# Patient Record
Sex: Female | Born: 1940
Health system: Southern US, Community
[De-identification: ages and names within clinical notes are randomized; demographics above are authoritative.]

## PROBLEM LIST (undated history)

## (undated) DIAGNOSIS — H269 Unspecified cataract: Secondary | ICD-10-CM

## (undated) DIAGNOSIS — R0902 Hypoxemia: Secondary | ICD-10-CM

## (undated) DIAGNOSIS — I10 Essential (primary) hypertension: Secondary | ICD-10-CM

## (undated) DIAGNOSIS — J449 Chronic obstructive pulmonary disease, unspecified: Secondary | ICD-10-CM

## (undated) DIAGNOSIS — G709 Myoneural disorder, unspecified: Secondary | ICD-10-CM

## (undated) DIAGNOSIS — M1990 Unspecified osteoarthritis, unspecified site: Secondary | ICD-10-CM

## (undated) HISTORY — DX: Unspecified cataract: H26.9

## (undated) HISTORY — DX: Hypoxemia: R09.02

## (undated) HISTORY — DX: Chronic obstructive pulmonary disease, unspecified: J44.9

## (undated) HISTORY — DX: Myoneural disorder, unspecified: G70.9

## (undated) HISTORY — PX: JOINT REPLACEMENT: SHX530

## (undated) HISTORY — PX: SPINE SURGERY: SHX786

## (undated) HISTORY — PX: COLON SURGERY: SHX602

## (undated) HISTORY — DX: Unspecified osteoarthritis, unspecified site: M19.90

## (undated) HISTORY — DX: Essential (primary) hypertension: I10

---

## 2003-12-17 ENCOUNTER — Ambulatory Visit: Payer: Self-pay | Admitting: Diagnostic Radiology

## 2009-06-08 ENCOUNTER — Ambulatory Visit: Payer: Self-pay | Admitting: Otolaryngology

## 2013-06-19 DIAGNOSIS — I480 Paroxysmal atrial fibrillation: Secondary | ICD-10-CM | POA: Insufficient documentation

## 2013-06-19 DIAGNOSIS — C189 Malignant neoplasm of colon, unspecified: Secondary | ICD-10-CM | POA: Insufficient documentation

## 2014-04-16 ENCOUNTER — Ambulatory Visit
Admit: 2014-04-16 | Disposition: A | Discharge status: Home / Self Care | Payer: Self-pay | Attending: Internal Medicine | Admitting: Internal Medicine

## 2014-05-08 ENCOUNTER — Inpatient Hospital Stay
Admission: EM | Admit: 2014-05-08 | Discharge: 2014-05-17 | DRG: 870 | Disposition: A | Discharge status: Other Acute Inpatient Hospital | Payer: Medicare Other | Attending: Internal Medicine | Admitting: Internal Medicine

## 2014-05-08 DIAGNOSIS — I4891 Unspecified atrial fibrillation: Secondary | ICD-10-CM | POA: Diagnosis present

## 2014-05-08 DIAGNOSIS — J159 Unspecified bacterial pneumonia: Secondary | ICD-10-CM | POA: Diagnosis present

## 2014-05-08 DIAGNOSIS — T17990A Other foreign object in respiratory tract, part unspecified in causing asphyxiation, initial encounter: Secondary | ICD-10-CM | POA: Diagnosis present

## 2014-05-08 DIAGNOSIS — M8580 Other specified disorders of bone density and structure, unspecified site: Secondary | ICD-10-CM | POA: Diagnosis present

## 2014-05-08 DIAGNOSIS — A419 Sepsis, unspecified organism: Secondary | ICD-10-CM | POA: Diagnosis present

## 2014-05-08 DIAGNOSIS — Z79899 Other long term (current) drug therapy: Secondary | ICD-10-CM

## 2014-05-08 DIAGNOSIS — I129 Hypertensive chronic kidney disease with stage 1 through stage 4 chronic kidney disease, or unspecified chronic kidney disease: Secondary | ICD-10-CM | POA: Diagnosis present

## 2014-05-08 DIAGNOSIS — N170 Acute kidney failure with tubular necrosis: Secondary | ICD-10-CM | POA: Diagnosis present

## 2014-05-08 DIAGNOSIS — J441 Chronic obstructive pulmonary disease with (acute) exacerbation: Secondary | ICD-10-CM | POA: Diagnosis present

## 2014-05-08 DIAGNOSIS — Z9221 Personal history of antineoplastic chemotherapy: Secondary | ICD-10-CM | POA: Diagnosis not present

## 2014-05-08 DIAGNOSIS — G9340 Encephalopathy, unspecified: Secondary | ICD-10-CM | POA: Diagnosis present

## 2014-05-08 DIAGNOSIS — N189 Chronic kidney disease, unspecified: Secondary | ICD-10-CM | POA: Diagnosis present

## 2014-05-08 DIAGNOSIS — I248 Other forms of acute ischemic heart disease: Secondary | ICD-10-CM | POA: Diagnosis present

## 2014-05-08 DIAGNOSIS — X58XXXA Exposure to other specified factors, initial encounter: Secondary | ICD-10-CM | POA: Diagnosis not present

## 2014-05-08 DIAGNOSIS — F17210 Nicotine dependence, cigarettes, uncomplicated: Secondary | ICD-10-CM | POA: Diagnosis present

## 2014-05-08 DIAGNOSIS — D696 Thrombocytopenia, unspecified: Secondary | ICD-10-CM | POA: Diagnosis present

## 2014-05-08 DIAGNOSIS — J9601 Acute respiratory failure with hypoxia: Secondary | ICD-10-CM | POA: Diagnosis present

## 2014-05-08 DIAGNOSIS — Z9049 Acquired absence of other specified parts of digestive tract: Secondary | ICD-10-CM | POA: Diagnosis present

## 2014-05-08 DIAGNOSIS — Z7982 Long term (current) use of aspirin: Secondary | ICD-10-CM

## 2014-05-08 DIAGNOSIS — E785 Hyperlipidemia, unspecified: Secondary | ICD-10-CM | POA: Diagnosis present

## 2014-05-08 DIAGNOSIS — D649 Anemia, unspecified: Secondary | ICD-10-CM | POA: Diagnosis present

## 2014-05-08 DIAGNOSIS — R6521 Severe sepsis with septic shock: Secondary | ICD-10-CM | POA: Diagnosis present

## 2014-05-08 DIAGNOSIS — Z9889 Other specified postprocedural states: Secondary | ICD-10-CM | POA: Diagnosis not present

## 2014-05-08 DIAGNOSIS — Z85038 Personal history of other malignant neoplasm of large intestine: Secondary | ICD-10-CM | POA: Diagnosis not present

## 2014-05-08 DIAGNOSIS — Z716 Tobacco abuse counseling: Secondary | ICD-10-CM | POA: Diagnosis present

## 2014-05-08 DIAGNOSIS — J9602 Acute respiratory failure with hypercapnia: Secondary | ICD-10-CM | POA: Diagnosis present

## 2014-05-08 DIAGNOSIS — J9600 Acute respiratory failure, unspecified whether with hypoxia or hypercapnia: Secondary | ICD-10-CM | POA: Diagnosis not present

## 2014-05-08 DIAGNOSIS — R34 Anuria and oliguria: Secondary | ICD-10-CM | POA: Diagnosis present

## 2014-05-08 LAB — CBC WITH DIFFERENTIAL/PLATELET
BASOS ABS: 0 10*3/uL (ref 0.0–0.1)
Basophil %: 0.2 %
EOS PCT: 0.1 %
Eosinophil #: 0 10*3/uL (ref 0.0–0.7)
HCT: 41.7 % (ref 35.0–47.0)
HGB: 13 g/dL (ref 12.0–16.0)
LYMPHS ABS: 0.7 10*3/uL — AB (ref 1.0–3.6)
Lymphocyte %: 5.2 %
MCH: 28.2 pg (ref 26.0–34.0)
MCHC: 31.1 g/dL — ABNORMAL LOW (ref 32.0–36.0)
MCV: 91 fL (ref 80–100)
MONO ABS: 0.7 x10 3/mm (ref 0.2–0.9)
MONOS PCT: 5.4 %
NEUTROS ABS: 11.3 10*3/uL — AB (ref 1.4–6.5)
Neutrophil %: 89.1 %
Platelet: 177 10*3/uL (ref 150–440)
RBC: 4.6 10*6/uL (ref 3.80–5.20)
RDW: 15.8 % — AB (ref 11.5–14.5)
WBC: 12.7 10*3/uL — ABNORMAL HIGH (ref 3.6–11.0)

## 2014-05-08 LAB — PROTIME-INR
INR: 0.9
Prothrombin Time: 11.8 secs

## 2014-05-08 LAB — URINALYSIS, COMPLETE
Bacteria: NONE SEEN
Bilirubin,UR: NEGATIVE
Blood: NEGATIVE
GLUCOSE, UR: NEGATIVE mg/dL (ref 0–75)
KETONE: NEGATIVE
Leukocyte Esterase: NEGATIVE
NITRITE: NEGATIVE
Ph: 5 (ref 4.5–8.0)
Protein: NEGATIVE
Specific Gravity: 1.014 (ref 1.003–1.030)
Squamous Epithelial: NONE SEEN

## 2014-05-08 LAB — CK-MB
CK-MB: 13.3 ng/mL — ABNORMAL HIGH
CK-MB: 14.5 ng/mL — ABNORMAL HIGH

## 2014-05-08 LAB — COMPREHENSIVE METABOLIC PANEL
ALBUMIN: 3.2 g/dL — AB
ALK PHOS: 70 U/L
Anion Gap: 13 (ref 7–16)
BUN: 51 mg/dL — AB
Bilirubin,Total: 0.2 mg/dL — ABNORMAL LOW
CALCIUM: 8.7 mg/dL — AB
CHLORIDE: 90 mmol/L — AB
CREATININE: 2.9 mg/dL — AB
Co2: 28 mmol/L
EGFR (African American): 18 — ABNORMAL LOW
EGFR (Non-African Amer.): 16 — ABNORMAL LOW
Glucose: 113 mg/dL — ABNORMAL HIGH
POTASSIUM: 4.5 mmol/L
SGOT(AST): 22 U/L
SGPT (ALT): 12 U/L — ABNORMAL LOW
SODIUM: 131 mmol/L — AB
Total Protein: 6.8 g/dL

## 2014-05-08 LAB — LACTIC ACID, PLASMA: LACTIC ACID, VENOUS: 1.6 mmol/L

## 2014-05-08 LAB — APTT: Activated PTT: 28 secs (ref 23.6–35.9)

## 2014-05-08 LAB — PHOSPHORUS: PHOSPHORUS: 7 mg/dL — AB

## 2014-05-08 LAB — TROPONIN I
TROPONIN-I: 0.5 ng/mL — AB
Troponin-I: 0.46 ng/mL — ABNORMAL HIGH

## 2014-05-08 LAB — MAGNESIUM: MAGNESIUM: 1.2 mg/dL — AB

## 2014-05-09 LAB — CBC WITH DIFFERENTIAL/PLATELET
BASOS PCT: 0.1 %
Basophil #: 0 10*3/uL (ref 0.0–0.1)
EOS ABS: 0 10*3/uL (ref 0.0–0.7)
Eosinophil %: 0.2 %
HCT: 36.9 % (ref 35.0–47.0)
HGB: 11.4 g/dL — AB (ref 12.0–16.0)
Lymphocyte #: 0.4 10*3/uL — ABNORMAL LOW (ref 1.0–3.6)
Lymphocyte %: 3.3 %
MCH: 28.2 pg (ref 26.0–34.0)
MCHC: 30.9 g/dL — AB (ref 32.0–36.0)
MCV: 91 fL (ref 80–100)
MONO ABS: 0.8 x10 3/mm (ref 0.2–0.9)
Monocyte %: 6.2 %
Neutrophil #: 11.4 10*3/uL — ABNORMAL HIGH (ref 1.4–6.5)
Neutrophil %: 90.2 %
Platelet: 139 10*3/uL — ABNORMAL LOW (ref 150–440)
RBC: 4.04 10*6/uL (ref 3.80–5.20)
RDW: 15.6 % — ABNORMAL HIGH (ref 11.5–14.5)
WBC: 12.6 10*3/uL — ABNORMAL HIGH (ref 3.6–11.0)

## 2014-05-09 LAB — LIPID PANEL
CHOLESTEROL: 101 mg/dL
HDL Cholesterol: 41 mg/dL
Ldl Cholesterol, Calc: 44 mg/dL
TRIGLYCERIDES: 80 mg/dL
VLDL Cholesterol, Calc: 16 mg/dL

## 2014-05-09 LAB — BASIC METABOLIC PANEL
ANION GAP: 6 — AB (ref 7–16)
BUN: 43 mg/dL — AB
CALCIUM: 7.9 mg/dL — AB
CO2: 20 mmol/L — AB
Chloride: 110 mmol/L
Creatinine: 1.9 mg/dL — ABNORMAL HIGH
EGFR (African American): 30 — ABNORMAL LOW
EGFR (Non-African Amer.): 26 — ABNORMAL LOW
Glucose: 146 mg/dL — ABNORMAL HIGH
POTASSIUM: 4.4 mmol/L
Sodium: 136 mmol/L

## 2014-05-09 LAB — TROPONIN I: TROPONIN-I: 0.46 ng/mL — AB

## 2014-05-09 LAB — PHOSPHORUS: PHOSPHORUS: 3.5 mg/dL

## 2014-05-09 LAB — CK-MB: CK-MB: 15.2 ng/mL — AB

## 2014-05-09 LAB — MAGNESIUM: Magnesium: 2.1 mg/dL

## 2014-05-09 LAB — HEPARIN LEVEL (UNFRACTIONATED): Anti-Xa(Unfractionated): <0.1 IU/mL — ABNORMAL LOW (ref 0.30–0.70)

## 2014-05-10 LAB — CBC WITH DIFFERENTIAL/PLATELET
BASOS PCT: 0.2 %
Basophil #: 0 10*3/uL (ref 0.0–0.1)
EOS PCT: 1 %
Eosinophil #: 0.1 10*3/uL (ref 0.0–0.7)
HCT: 33.4 % — ABNORMAL LOW (ref 35.0–47.0)
HGB: 10.3 g/dL — ABNORMAL LOW (ref 12.0–16.0)
Lymphocyte #: 0.6 10*3/uL — ABNORMAL LOW (ref 1.0–3.6)
Lymphocyte %: 7.8 %
MCH: 28.1 pg (ref 26.0–34.0)
MCHC: 30.7 g/dL — ABNORMAL LOW (ref 32.0–36.0)
MCV: 92 fL (ref 80–100)
Monocyte #: 0.6 x10 3/mm (ref 0.2–0.9)
Monocyte %: 7.4 %
Neutrophil #: 6.5 10*3/uL (ref 1.4–6.5)
Neutrophil %: 83.6 %
PLATELETS: 126 10*3/uL — AB (ref 150–440)
RBC: 3.65 10*6/uL — AB (ref 3.80–5.20)
RDW: 15.5 % — ABNORMAL HIGH (ref 11.5–14.5)
WBC: 7.8 10*3/uL (ref 3.6–11.0)

## 2014-05-10 LAB — BASIC METABOLIC PANEL
Anion Gap: 3 — ABNORMAL LOW (ref 7–16)
BUN: 38 mg/dL — ABNORMAL HIGH
CHLORIDE: 114 mmol/L — AB
CO2: 22 mmol/L
CREATININE: 1.52 mg/dL — AB
Calcium, Total: 8.1 mg/dL — ABNORMAL LOW
EGFR (African American): 39 — ABNORMAL LOW
GFR CALC NON AF AMER: 34 — AB
Glucose: 159 mg/dL — ABNORMAL HIGH
POTASSIUM: 3.6 mmol/L
Sodium: 139 mmol/L

## 2014-05-10 LAB — HEPARIN LEVEL (UNFRACTIONATED)
ANTI-XA(UNFRACTIONATED): 0.67 [IU]/mL (ref 0.30–0.70)
Anti-Xa(Unfractionated): 0.41 IU/mL (ref 0.30–0.70)

## 2014-05-10 LAB — URINE CULTURE

## 2014-05-10 LAB — MAGNESIUM: MAGNESIUM: 1.9 mg/dL

## 2014-05-11 LAB — CBC WITH DIFFERENTIAL/PLATELET
BANDS NEUTROPHIL: 6 %
COMMENT - H1-COM3: NORMAL
HCT: 36.6 % (ref 35.0–47.0)
HGB: 10.9 g/dL — ABNORMAL LOW (ref 12.0–16.0)
Lymphocytes: 4 %
MCH: 28.4 pg (ref 26.0–34.0)
MCHC: 29.9 g/dL — AB (ref 32.0–36.0)
MCV: 95 fL (ref 80–100)
MONOS PCT: 6 %
MYELOCYTE: 1 %
Metamyelocyte: 2 %
NRBC/100 WBC: 1 /
PLATELETS: 106 10*3/uL — AB (ref 150–440)
RBC: 3.86 10*6/uL (ref 3.80–5.20)
RDW: 15.7 % — AB (ref 11.5–14.5)
SEGMENTED NEUTROPHILS: 81 %
WBC: 14.9 10*3/uL — ABNORMAL HIGH (ref 3.6–11.0)

## 2014-05-11 LAB — BASIC METABOLIC PANEL
ANION GAP: 1 — AB (ref 7–16)
Anion Gap: 7 (ref 7–16)
BUN: 39 mg/dL — ABNORMAL HIGH
BUN: 41 mg/dL — AB
CALCIUM: 8.6 mg/dL — AB
CREATININE: 2.11 mg/dL — AB
CREATININE: 2.5 mg/dL — AB
Calcium, Total: 8.6 mg/dL — ABNORMAL LOW
Chloride: 117 mmol/L — ABNORMAL HIGH
Chloride: 117 mmol/L — ABNORMAL HIGH
Co2: 22 mmol/L
Co2: 18 mmol/L — ABNORMAL LOW
EGFR (African American): 26 — ABNORMAL LOW
EGFR (African American): 22 — ABNORMAL LOW
EGFR (Non-African Amer.): 23 — ABNORMAL LOW
GFR CALC NON AF AMER: 19 — AB
GLUCOSE: 165 mg/dL — AB
Glucose: 142 mg/dL — ABNORMAL HIGH
Potassium: 5.1 mmol/L
Potassium: 3.3 mmol/L — ABNORMAL LOW
SODIUM: 142 mmol/L
Sodium: 140 mmol/L

## 2014-05-11 LAB — MAGNESIUM: Magnesium: 2 mg/dL

## 2014-05-11 LAB — HEPARIN LEVEL (UNFRACTIONATED): Anti-Xa(Unfractionated): 0.95 IU/mL — ABNORMAL HIGH (ref 0.30–0.70)

## 2014-05-11 LAB — PHOSPHORUS: Phosphorus: 5.4 mg/dL — ABNORMAL HIGH

## 2014-05-12 LAB — CBC WITH DIFFERENTIAL/PLATELET
HCT: 36.7 % (ref 35.0–47.0)
HGB: 11.5 g/dL — ABNORMAL LOW (ref 12.0–16.0)
Lymphocytes: 8 %
MCH: 28.1 pg (ref 26.0–34.0)
MCHC: 31.2 g/dL — AB (ref 32.0–36.0)
MCV: 90 fL (ref 80–100)
Monocytes: 3 %
NRBC/100 WBC: 2 /
Platelet: 71 10*3/uL — ABNORMAL LOW (ref 150–440)
RBC: 4.07 10*6/uL (ref 3.80–5.20)
RDW: 16 % — ABNORMAL HIGH (ref 11.5–14.5)
Segmented Neutrophils: 89 %
WBC: 11.4 10*3/uL — ABNORMAL HIGH (ref 3.6–11.0)

## 2014-05-12 LAB — BASIC METABOLIC PANEL
Anion Gap: 5 — ABNORMAL LOW (ref 7–16)
BUN: 46 mg/dL — ABNORMAL HIGH
CREATININE: 2.66 mg/dL — AB
Calcium, Total: 8.7 mg/dL — ABNORMAL LOW
Chloride: 115 mmol/L — ABNORMAL HIGH
Co2: 19 mmol/L — ABNORMAL LOW
EGFR (African American): 20 — ABNORMAL LOW
EGFR (Non-African Amer.): 17 — ABNORMAL LOW
Glucose: 120 mg/dL — ABNORMAL HIGH
POTASSIUM: 3.7 mmol/L
SODIUM: 139 mmol/L

## 2014-05-12 LAB — VANCOMYCIN, TROUGH: Vancomycin, Trough: 20 ug/mL

## 2014-05-12 LAB — PHOSPHORUS: Phosphorus: 2.4 mg/dL — ABNORMAL LOW

## 2014-05-12 LAB — MAGNESIUM: Magnesium: 1.8 mg/dL

## 2014-05-13 LAB — BASIC METABOLIC PANEL
Anion Gap: 5 — ABNORMAL LOW (ref 7–16)
BUN: 59 mg/dL — ABNORMAL HIGH
CHLORIDE: 114 mmol/L — AB
Calcium, Total: 9.1 mg/dL
Co2: 20 mmol/L — ABNORMAL LOW
Creatinine: 3.5 mg/dL — ABNORMAL HIGH
EGFR (African American): 14 — ABNORMAL LOW
EGFR (Non-African Amer.): 12 — ABNORMAL LOW
GLUCOSE: 112 mg/dL — AB
Potassium: 4.2 mmol/L
Sodium: 139 mmol/L

## 2014-05-13 LAB — CBC WITH DIFFERENTIAL/PLATELET
BASOS ABS: 0 10*3/uL (ref 0.0–0.1)
Basophil %: 0.3 %
EOS ABS: 0 10*3/uL (ref 0.0–0.7)
EOS PCT: 0 %
HCT: 34.4 % — ABNORMAL LOW (ref 35.0–47.0)
HGB: 10.9 g/dL — ABNORMAL LOW (ref 12.0–16.0)
LYMPHS ABS: 0.6 10*3/uL — AB (ref 1.0–3.6)
Lymphocyte %: 4.3 %
MCH: 28.4 pg (ref 26.0–34.0)
MCHC: 31.7 g/dL — AB (ref 32.0–36.0)
MCV: 90 fL (ref 80–100)
Monocyte #: 0.8 x10 3/mm (ref 0.2–0.9)
Monocyte %: 5.7 %
NEUTROS ABS: 12.2 10*3/uL — AB (ref 1.4–6.5)
Neutrophil %: 89.7 %
Platelet: 72 10*3/uL — ABNORMAL LOW (ref 150–440)
RBC: 3.84 10*6/uL (ref 3.80–5.20)
RDW: 16.6 % — AB (ref 11.5–14.5)
WBC: 13.6 10*3/uL — ABNORMAL HIGH (ref 3.6–11.0)

## 2014-05-13 LAB — PHOSPHORUS: PHOSPHORUS: 3.1 mg/dL

## 2014-05-13 LAB — CULTURE, BLOOD (SINGLE)

## 2014-05-13 LAB — VANCOMYCIN, TROUGH: VANCOMYCIN, TROUGH: 18 ug/mL

## 2014-05-14 LAB — BASIC METABOLIC PANEL
Anion Gap: 7 (ref 7–16)
BUN: 60 mg/dL — AB
Calcium, Total: 8.5 mg/dL — ABNORMAL LOW
Chloride: 109 mmol/L
Co2: 23 mmol/L
Creatinine: 3.51 mg/dL — ABNORMAL HIGH
EGFR (African American): 14 — ABNORMAL LOW
GFR CALC NON AF AMER: 12 — AB
Glucose: 118 mg/dL — ABNORMAL HIGH
POTASSIUM: 3.6 mmol/L
SODIUM: 139 mmol/L

## 2014-05-14 LAB — CBC WITH DIFFERENTIAL/PLATELET
Basophil #: 0 10*3/uL (ref 0.0–0.1)
Basophil %: 0.1 %
EOS ABS: 0 10*3/uL (ref 0.0–0.7)
EOS PCT: 0.3 %
HCT: 28.6 % — ABNORMAL LOW (ref 35.0–47.0)
HGB: 9 g/dL — ABNORMAL LOW (ref 12.0–16.0)
Lymphocyte #: 0.8 10*3/uL — ABNORMAL LOW (ref 1.0–3.6)
Lymphocyte %: 5.8 %
MCH: 27.7 pg (ref 26.0–34.0)
MCHC: 31.6 g/dL — ABNORMAL LOW (ref 32.0–36.0)
MCV: 88 fL (ref 80–100)
Monocyte #: 1.3 x10 3/mm — ABNORMAL HIGH (ref 0.2–0.9)
Monocyte %: 9.5 %
NEUTROS ABS: 11.7 10*3/uL — AB (ref 1.4–6.5)
Neutrophil %: 84.3 %
Platelet: 53 10*3/uL — ABNORMAL LOW (ref 150–440)
RBC: 3.26 10*6/uL — AB (ref 3.80–5.20)
RDW: 16.1 % — ABNORMAL HIGH (ref 11.5–14.5)
WBC: 13.9 10*3/uL — ABNORMAL HIGH (ref 3.6–11.0)

## 2014-05-14 LAB — PHOSPHORUS: Phosphorus: 3.2 mg/dL

## 2014-05-14 LAB — CULTURE, BLOOD (SINGLE)

## 2014-05-14 LAB — VANCOMYCIN, TROUGH: Vancomycin, Trough: 16 ug/mL

## 2014-05-15 DIAGNOSIS — A419 Sepsis, unspecified organism: Secondary | ICD-10-CM | POA: Diagnosis present

## 2014-05-15 LAB — CBC WITH DIFFERENTIAL/PLATELET
BASOS PCT: 0.2 %
Basophil #: 0 10*3/uL (ref 0.0–0.1)
EOS PCT: 1.7 %
Eosinophil #: 0.2 10*3/uL (ref 0.0–0.7)
HCT: 26.8 % — ABNORMAL LOW (ref 35.0–47.0)
HGB: 8.7 g/dL — AB (ref 12.0–16.0)
LYMPHS PCT: 8.8 %
Lymphocyte #: 1.2 10*3/uL (ref 1.0–3.6)
MCH: 28.2 pg (ref 26.0–34.0)
MCHC: 32.5 g/dL (ref 32.0–36.0)
MCV: 87 fL (ref 80–100)
MONOS PCT: 8.8 %
Monocyte #: 1.2 x10 3/mm — ABNORMAL HIGH (ref 0.2–0.9)
NEUTROS ABS: 11.2 10*3/uL — AB (ref 1.4–6.5)
Neutrophil %: 80.5 %
Platelet: 50 10*3/uL — ABNORMAL LOW (ref 150–440)
RBC: 3.09 10*6/uL — ABNORMAL LOW (ref 3.80–5.20)
RDW: 16.6 % — ABNORMAL HIGH (ref 11.5–14.5)
WBC: 13.9 10*3/uL — ABNORMAL HIGH (ref 3.6–11.0)

## 2014-05-15 LAB — BASIC METABOLIC PANEL
Anion Gap: 6 — ABNORMAL LOW (ref 7–16)
BUN: 54 mg/dL — AB
CALCIUM: 8.3 mg/dL — AB
CO2: 26 mmol/L
Chloride: 107 mmol/L
Creatinine: 3.18 mg/dL — ABNORMAL HIGH
EGFR (African American): 16 — ABNORMAL LOW
EGFR (Non-African Amer.): 14 — ABNORMAL LOW
GLUCOSE: 121 mg/dL — AB
Potassium: 3.3 mmol/L — ABNORMAL LOW
SODIUM: 139 mmol/L

## 2014-05-15 LAB — PHOSPHORUS: PHOSPHORUS: 2.4 mg/dL — AB

## 2014-05-15 LAB — POTASSIUM
Potassium: 3.8 mmol/L
Potassium: 3.3 mmol/L — ABNORMAL LOW

## 2014-05-15 LAB — MAGNESIUM: MAGNESIUM: 1.7 mg/dL

## 2014-05-15 LAB — BRONCHIAL WASH CULTURE

## 2014-05-15 MED ORDER — GABAPENTIN 250 MG/5ML PO SOLN
600.0000 mg | Freq: Three times a day (TID) | ORAL | Status: DC
  Administered 2014-05-16 – 2014-05-17 (×5): 600 mg via ORAL
  Filled 2014-05-15 (×11): qty 12

## 2014-05-15 MED ORDER — CHLORHEXIDINE GLUCONATE 0.12 % MT SOLN: 15.0000 mL | Freq: Two times a day (BID) | OROMUCOSAL | Status: DC

## 2014-05-15 MED ORDER — HYDRALAZINE HCL 20 MG/ML IJ SOLN: 10.0000 mg | INTRAMUSCULAR | Status: DC | PRN

## 2014-05-15 MED ORDER — ACETAMINOPHEN 325 MG PO TABS: 325.0000 mg | ORAL_TABLET | ORAL | Status: DC | PRN

## 2014-05-15 MED ORDER — SODIUM CHLORIDE 0.9 % IJ SOLN: 10.0000 mL | INTRAMUSCULAR | Status: DC | PRN

## 2014-05-15 MED ORDER — FENTANYL CITRATE (PF) 100 MCG/2ML IJ SOLN
50.0000 ug | INTRAMUSCULAR | Status: DC | PRN
  Administered 2014-05-16: 50 ug via INTRAVENOUS

## 2014-05-15 MED ORDER — BUDESONIDE 0.5 MG/2ML IN SUSP: 0.5000 mg | Freq: Two times a day (BID) | RESPIRATORY_TRACT | Status: DC

## 2014-05-15 MED ORDER — DILTIAZEM HCL 30 MG PO TABS
30.0000 mg | ORAL_TABLET | Freq: Four times a day (QID) | ORAL | Status: DC
  Administered 2014-05-16 – 2014-05-17 (×8): 30 mg via ORAL
  Filled 2014-05-15 (×7): qty 1

## 2014-05-15 MED ORDER — NITROGLYCERIN 0.4 MG SL SUBL: 0.4000 mg | SUBLINGUAL_TABLET | SUBLINGUAL | Status: DC | PRN

## 2014-05-15 MED ORDER — PNEUMOCOCCAL VAC POLYVALENT 25 MCG/0.5ML IJ INJ: 0.5000 mL | INJECTION | INTRAMUSCULAR | Status: DC | PRN

## 2014-05-15 MED ORDER — SODIUM CHLORIDE 0.9 % IJ SOLN
10.0000 mL | Freq: Two times a day (BID) | INTRAMUSCULAR | Status: DC
  Administered 2014-05-16 – 2014-05-17 (×4): 10 mL via INTRAVENOUS

## 2014-05-15 MED ORDER — IPRATROPIUM-ALBUTEROL 0.5-2.5 (3) MG/3ML IN SOLN
3.0000 mL | RESPIRATORY_TRACT | Status: DC
  Administered 2014-05-16 – 2014-05-17 (×12): 3 mL via RESPIRATORY_TRACT
  Filled 2014-05-15 (×10): qty 3

## 2014-05-15 MED ORDER — ONDANSETRON HCL 4 MG/2ML IJ SOLN: 4.0000 mg | Freq: Four times a day (QID) | INTRAMUSCULAR | Status: DC | PRN

## 2014-05-15 MED ORDER — VITAL HIGH PROTEIN PO LIQD
1000.0000 mL | ORAL | Status: DC
  Administered 2014-05-15 – 2014-05-16 (×2): 1000 mL

## 2014-05-15 MED ORDER — ASPIRIN 81 MG PO CHEW
81.0000 mg | CHEWABLE_TABLET | Freq: Every day | ORAL | Status: DC
  Administered 2014-05-16 – 2014-05-17 (×2): 81 mg via ORAL
  Filled 2014-05-15 (×2): qty 1

## 2014-05-15 MED ORDER — DOCUSATE SODIUM 50 MG/5ML PO LIQD
100.0000 mg | Freq: Every day | ORAL | Status: DC
  Administered 2014-05-16 – 2014-05-17 (×2): 100 mg via ORAL
  Filled 2014-05-15 (×2): qty 10

## 2014-05-15 MED ORDER — BUDESONIDE 0.5 MG/2ML IN SUSP
0.5000 mg | Freq: Two times a day (BID) | RESPIRATORY_TRACT | Status: DC
  Administered 2014-05-16 – 2014-05-17 (×4): 0.5 mg via RESPIRATORY_TRACT
  Filled 2014-05-15 (×4): qty 2

## 2014-05-15 MED ORDER — LORAZEPAM 2 MG/ML IJ SOLN: 1.0000 mg | INTRAMUSCULAR | Status: DC | PRN

## 2014-05-15 MED ORDER — FAMOTIDINE 40 MG/5ML PO SUSR
20.0000 mg | ORAL | Status: DC
  Administered 2014-05-17: 20 mg via ORAL
  Filled 2014-05-15: qty 2.5

## 2014-05-15 MED ORDER — INSULIN ASPART 100 UNIT/ML ~~LOC~~ SOLN: 0.0000 [IU] | Freq: Four times a day (QID) | SUBCUTANEOUS | Status: DC

## 2014-05-15 MED ORDER — PIPERACILLIN-TAZOBACTAM 3.375 G IVPB
3.3750 g | Freq: Two times a day (BID) | INTRAVENOUS | Status: DC
  Administered 2014-05-16 – 2014-05-17 (×4): 3.375 g via INTRAVENOUS
  Filled 2014-05-15 (×4): qty 50

## 2014-05-16 DIAGNOSIS — A419 Sepsis, unspecified organism: Principal | ICD-10-CM

## 2014-05-16 DIAGNOSIS — G9340 Encephalopathy, unspecified: Secondary | ICD-10-CM

## 2014-05-16 DIAGNOSIS — N170 Acute kidney failure with tubular necrosis: Secondary | ICD-10-CM

## 2014-05-16 DIAGNOSIS — J9600 Acute respiratory failure, unspecified whether with hypoxia or hypercapnia: Secondary | ICD-10-CM

## 2014-05-16 LAB — GLUCOSE, CAPILLARY
GLUCOSE-CAPILLARY: 120 mg/dL — AB (ref 70–99)
GLUCOSE-CAPILLARY: 123 mg/dL — AB (ref 70–99)
Glucose-Capillary: 117 mg/dL — ABNORMAL HIGH (ref 70–99)
Glucose-Capillary: 116 mg/dL — ABNORMAL HIGH (ref 70–99)
Glucose-Capillary: 129 mg/dL — ABNORMAL HIGH (ref 70–99)

## 2014-05-16 LAB — BASIC METABOLIC PANEL
Anion gap: 5 (ref 5–15)
BUN: 39 mg/dL — ABNORMAL HIGH (ref 6–20)
CO2: 29 mmol/L (ref 22–32)
Calcium: 8.1 mg/dL — ABNORMAL LOW (ref 8.9–10.3)
Chloride: 105 mmol/L (ref 101–111)
Creatinine, Ser: 2.52 mg/dL — ABNORMAL HIGH (ref 0.44–1.00)
GFR, EST AFRICAN AMERICAN: 21 mL/min — AB (ref >60)
GFR, EST NON AFRICAN AMERICAN: 18 mL/min — AB (ref >60)
Glucose, Bld: 125 mg/dL — ABNORMAL HIGH (ref 65–99)
POTASSIUM: 3.9 mmol/L (ref 3.5–5.1)
SODIUM: 139 mmol/L (ref 135–145)

## 2014-05-16 LAB — MAGNESIUM: MAGNESIUM: 1.6 mg/dL — AB (ref 1.7–2.4)

## 2014-05-16 LAB — PHOSPHORUS: Phosphorus: 1.6 mg/dL — ABNORMAL LOW (ref 2.5–4.6)

## 2014-05-16 MED ORDER — CETYLPYRIDINIUM CHLORIDE 0.05 % MT LIQD: 7.0000 mL | Freq: Four times a day (QID) | OROMUCOSAL | Status: DC

## 2014-05-16 MED ORDER — CHLORHEXIDINE GLUCONATE 0.12 % MT SOLN: 15.0000 mL | Freq: Two times a day (BID) | OROMUCOSAL | Status: DC

## 2014-05-16 MED ORDER — CHLORHEXIDINE GLUCONATE 0.12 % MT SOLN: 5.0000 mL | Freq: Two times a day (BID) | OROMUCOSAL | Status: DC

## 2014-05-16 MED ORDER — NITROGLYCERIN 0.4 MG SL SUBL: 0.4000 mg | SUBLINGUAL_TABLET | SUBLINGUAL | Status: DC | PRN

## 2014-05-16 MED ORDER — DEXMEDETOMIDINE HCL 200 MCG/2ML IV SOLN: 2.0000 ug/kg | INTRAVENOUS | Status: DC

## 2014-05-16 MED ORDER — CETYLPYRIDINIUM CHLORIDE 0.05 % MT LIQD
7.0000 mL | Freq: Four times a day (QID) | OROMUCOSAL | Status: DC
  Administered 2014-05-16 – 2014-05-17 (×4): 7 mL via OROMUCOSAL

## 2014-05-16 MED ORDER — MAGNESIUM SULFATE 2 GM/50ML IV SOLN
2.0000 g | Freq: Once | INTRAVENOUS | Status: AC
Start: 2014-05-16 — End: 2014-05-16
  Administered 2014-05-16: 2 g via INTRAVENOUS
  Filled 2014-05-16 (×2): qty 50

## 2014-05-16 MED ORDER — CHLORHEXIDINE GLUCONATE 0.12 % MT SOLN
15.0000 mL | Freq: Two times a day (BID) | OROMUCOSAL | Status: DC
  Administered 2014-05-16 – 2014-05-17 (×3): 15 mL via OROMUCOSAL

## 2014-05-16 MED ORDER — FENTANYL 2500MCG IN NS 250ML (10MCG/ML) PREMIX INFUSION
50.0000 ug/h | INTRAVENOUS | Status: DC
  Administered 2014-05-15 – 2014-05-16 (×2): 50 ug/h via INTRAVENOUS
  Filled 2014-05-16 (×2): qty 250

## 2014-05-16 MED ORDER — DEXMEDETOMIDINE HCL IN NACL 200 MCG/50ML IV SOLN
0.4000 ug/kg/h | INTRAVENOUS | Status: DC
  Filled 2014-05-16: qty 50

## 2014-05-16 MED ORDER — DEXMEDETOMIDINE HCL IN NACL 400 MCG/100ML IV SOLN
0.4000 ug/kg/h | INTRAVENOUS | Status: DC
  Administered 2014-05-16: 1 ug/kg/h via INTRAVENOUS
  Administered 2014-05-17: 0.06 ug/kg/h via INTRAVENOUS
  Filled 2014-05-16 (×2): qty 100

## 2014-05-16 NOTE — Op Note (Signed)
PATIENT NAME:  Caitlyn Wang, PESTKA MR#:  [id number] DATE OF BIRTH:  Nov 02, 1940  DATE OF PROCEDURE:  05/13/2014  PREOPERATIVE DIAGNOSES:   1.  Acute renal failure. 2.  Multisystem organ failure. 3.  Tobacco dependence.   POSTOPERATIVE DIAGNOSES:   1.  Acute renal failure. 2.  Multisystem organ failure. 3.  Tobacco dependence.  PROCEDURES:  1. Ultrasound guidance for vascular access to the right femoral vein.  2. Placement of non-tunneled dialysis catheter into the right femoral vein.  SURGEON: Algernon Huxley, MD   ANESTHESIA: Local.   BLOOD LOSS: Minimal.   INDICATION FOR PROCEDURE: A 74 year old female who is critically ill in the critical care unit. She has been in the hospital almost a week and has been getting gradually worse. She has multisystem organ failure. She is ventilator-dependent and she now has acute renal failure. We are asked to place a dialysis catheter for initiation of dialysis today. Risks and benefits were discussed. Informed consent was obtained.   DESCRIPTION OF THE PROCEDURE: The patient's right groin was sterilely prepped and draped and a sterile surgical field was created. The right femoral vein was visualized with ultrasound and found to be patent. It was then accessed under direct ultrasound guidance without difficulty with a Seldinger needle and a permanent image was recorded. A J-wire was placed after skin nick and dilatation. The 20 cm DuoGlide dialysis catheter was then placed over the wire and the wire was removed. All lumens withdrew blood well and flushed easily with sterile saline. It was secured to the skin with 3 nylon sutures. A sterile dressing was placed. The patient tolerated the procedure well and remained in his bed in a stable condition.     ____________________________ Algernon Huxley, MD jsd:TM D: 05/13/2014 17:36:49 ET T: 05/14/2014 00:06:30 ET JOB#: [id number]  cc: Algernon Huxley, MD, <Dictator> Algernon Huxley MD ELECTRONICALLY SIGNED 05/14/2014  17:29

## 2014-05-16 NOTE — H&P (Signed)
PATIENT NAME:  Caitlyn Wang, HERRO MR#:  [id number] DATE OF BIRTH:  December 13, 1940  DATE OF ADMISSION:  05/08/2014  PRIMARY CARE PHYSICIAN: Nonlocal at [facility]  REFERRING EMERGENCY ROOM PHYSICIAN:  Dr. Joni Fears    CHIEF COMPLAINT:  Altered mental status.   HISTORY OF PRESENT ILLNESS:  The patient is a 74 year old pleasant female with a past medical history of osteopenia.   She is brought into the ED with a chief complaint of altered mental status for the past 2 days.  According to her caretaker  as well as healthcare power of attorney, Caitlyn Wang, the patient has been lethargic for the past 2 days and has been not eating or drinking well.  Chest x-ray has revealed a right lower lobe pneumonia.  The patient was initially hypertensive when she was brought into the ED, but her blood pressure eventually found to be very low and during my examination, she was significantly hypotensive with a blood pressure at 85/46.  The patient,  being septic, received 30 mL per kg of  IV fluids and another fluid bolus was ordered.  She was cultured and started on broad-spectrum antibiotics with Zosyn, Levaquin and vancomycin by the ED physician, and hospitalist team is called to admit the patient.  The patient's renal function is also abnormal with creatinine at 2.90.  CPK-MB is elevated at 13.3 and troponin is at 0.46.  The patient denies any chest pain, but lethargic.  Answers a few questions appropriately and suddenly falling asleep.  Denies any chest pain.  Denies any loss of consciousness.  No fevers.   PAST MEDICAL HISTORY: Osteopenia, colon cancer, status post chemotherapy.     PAST SURGICAL HISTORY:  Colon surgery and chemotherapy for colon cancer,  collarbone surgery, neck surgery, brain aneurysm, status post clipping.   ALLERGIES:  No known drug allergies.   PSYCHOSOCIAL HISTORY:  Lives at home with her caregiver, Caitlyn Wang, who is also healthcare power of attorney.  Smokes  2 to 3 packs per day.  Has been  smoking for more than 50 years.  Has more than 100-pack-years of smoking history.  Social drinking.  Denies any illicit drug usage.   FAMILY HISTORY:  Reviewed, probably hypertension runs in her family.   HOME MEDICATIONS:   OxyContin 40 mg 1 pill 2 times a day, Coreg 20 mg 1 pill twice a day, folic acid 1 mg once daily, hydrocortisone 20 mg once daily, lisinopril 20 mg once daily, mirtazapine 30 mg once daily, Lipitor 80 mg once daily, aspirin 81 mg once daily, Prilosec over the counter once daily, calcium 200 mg 2 times a day, vitamin D 200 international units once daily, magnesium 100 mg once daily, potassium chloride dose unknown, Benadryl as needed.  REVIEW OF SYSTEMS:  CONSTITUTIONAL:   Denies any fever, but complaining of significant weakness.   EYES:  Denies blurry vision, double vision.  EARS, NOSE, AND THROAT:  Denies epistaxis, discharge.  RESPIRATORY:  Intermittent episodes of cough with productive phlegm.  Officially meets criteria for COPD but not titled as COPD.   GASTROINTESTINAL:  No nausea, vomiting, diarrhea, abdominal pain.  GENITOURINARY:  No dysuria, hematuria. ENDOCRINE:   Denies polyuria,  nocturia or thyroid problems.  HEMATOLOGIC AND LYMPHATIC:  No anemia, easy bruising or bleeding.  INTEGUMENTARY:   No acne, rash or lesions.  MUSCULOSKELETAL:  Has chronic back pain and neck pain, has osteopenia.  NEUROLOGIC:  Intermittent episodes of confusion, but no vertigo or ataxia.  PSYCHIATRIC:  History of depression  and anxiety.   PHYSICAL EXAMINATION:  VITAL SIGNS:  Temperature 98.5, pulse 60, respirations 18, blood pressure initially when she came into the ED 201/157, but during my examination it dropped down to 85/46, pulse oximetry  99%.  GENERAL APPEARANCE:  Not in acute distress, but lethargic.  HEENT:  Normocephalic, atraumatic.  Pupils are equal and reactive to light and accommodation. No scleral icterus.  No conjunctival injection.  No sinus tenderness, dry mucous  membranes.  NECK:  Supple.  No JVD.  No thyromegaly.  LUNGS:  Positive crackles.  CARDIOVASCULAR:  S1, S2 normal.  Regular rate and rhythm.  GASTROINTESTINAL:  Soft.  Bowel sounds are positive in all four quadrants.  Nontender, nondistended.  No masses.   NEUROLOGIC:  Lethargic but arousable and answers most of the questions and then she falls asleep.  Reflexes are 2+.  EXTREMITIES:  No edema, no cyanosis, no clubbing.  SKIN:  Warm to touch, dry in nature.    DIAGNOSTIC DATA:  WBC 12.7.  Hemoglobin, hematocrit, and platelets are normal.  CPK-MB is elevated at 13.3.  Troponin 0.46.  PT-INR is normal.  Urinalysis:  yellow in color, clear in appearance.  Nitrites are negative.  Leukocyte esterase is negative.  Urine glucose is negative, hyaline casts are present.  ABGs: pH 7.330, pCO2 48, pO2 43, FiO2 21%.  LFTs: Total protein 6.8, albumin low at 3.2, total bilirubin  0.2, alkaline phosphatase and AST are normal, ALT at 12, glucose 113, BUN 51, creatinine 2.90, sodium 131, potassium 4.5, chloride 90, CO2 28, GFR 15.  Anion gap is 13, calcium 8.7, phosphorus 7.0, magnesium 1.2.  Lactic acid level is at 1.7. Chest x-ray, portable:  Right lower lobe airspace opacity suspicious for pneumonia.  Followup in 4 weeks to ensure clearing and excluding underlying lesion is recommended.    CT head is ordered.  Echocardiogram was ordered, results are pending.  A 12-lead EKG revealed normal sinus rhythm with occasional premature ventricular complexes, normal PR interval, normal QRS. no acute ST-T wave changes.    ASSESSMENT AND PLAN:  A 74 year old Caucasian female brought in to the ED with  lethargy, diagnosed with right lower lobe pneumonia, found to be hypotensive with leukocytosis.  She usually gets her [facility] at [facility].  Will be admitted with the following assessment and plan:  1.  Altered mental status probably from sepsis and underlying pneumonia.  We will admit her to intensive care unit for close monitoring in view  of her hypotension.   2.  Sepsis.  Meets sepsis criteria with leukocytosis, hypotension, and a diagnosis of right lower lobe pneumonia.  The patient is found to be hypoxic which is probably from pneumonia.  We will provide her Zosyn, Levaquin and vancomycin which was started by the ED physician. 3.  Hypomagnesemia.  We will replete magnesium and check a.m. laboratory studies.  4.  Acute kidney injury, probably from decreased p.o. intake, which could be probably prerenal. We will provide aggressive hydration with IV fluids, get a renal ultrasound and nephrology consult.  According to the family members, the patient does not have any history of renal problems.  5.  Elevated troponin, could be from demand ischemia versus non-ST-elevated myocardial infarction. Will cycle cardiac biomarkers.  In the interim, the patient will be started on heparin drip, it will be discontinued if  the cardiac enzymes are  negative.  6.  History of colon cancer, status post colon surgery and chemotherapy.   Will recommend the patient to followup with  the oncology for surveillance as recommended by them.  7.  Chronic history of osteopenia.  Continue her home medications.  8.  History of hypertension.  We will hold off her blood pressure medications and narcotics for low blood pressures.   9.  Will provide her gastrointestinal prophylaxis.  Deep vein thrombosis prophylaxis is not needed as the patient  takes Heparin.   10.  Tobacco abuse. Consult patient to quit smoking.  She is not ready for nicotine patch at this time.  Counseling was provided for approximately 3 minutes.   CODE STATUS:  She is a FULL CODE.  Her caregiver Mrs. Synetta Wang is the medical power of attorney.      TIME SPENT:  50 minutes.     ____________________________ Nicholes Mango, MD [phone] D: 05/08/2014 19:34:41 ET T: 05/08/2014 21:23:31 ET JOB#: [id number]  cc: Nicholes Mango, MD, <Dictator> Nicholes Mango MD ELECTRONICALLY SIGNED 05/09/2014 13:20

## 2014-05-16 NOTE — Progress Notes (Signed)
Tried to wean Fentanyl. Patient had 10 good breaths then apneic. Even with patient sedation weaned refused to follow commands. Attempted to get out of bed but too weak. Tried to pull out ETT with mitted hands. Finally increased sedation to prevent self extubation. Explained several times that she would not survive without vent. At this time. Updated Health care POA at [place].

## 2014-05-16 NOTE — Progress Notes (Signed)
Bruised,thin, weeping skin

## 2014-05-16 NOTE — Progress Notes (Signed)
PULMONARY / CRITICAL CARE MEDICINE   Name: Caitlyn Wang MRN: [id number] DOB: 01/25/1940    ADMISSION DATE:  05/08/2014  SUBJECTIVE:  patient remains intubated, fio2 at 35% CXR with RT sided opacification s/p Bronch shows extensive purulant secretions and mucus plugs, remains critically ill, peep at 5, will attempt to wean fio2 and peep as tolerated and attempt SAT/SBT VITAL SIGNS: Temp:  [99.9 F (37.7 C)] 99.9 F (37.7 C) (05/01 0800) Pulse Rate:  [93-110] 110 (05/01 1100) Resp:  [18-26] 18 (05/01 1100) BP: (104-137)/(51-67) 134/63 mmHg (05/01 1100) SpO2:  [96 %-98 %] 98 % (05/01 1100) FiO2 (%):  [35 %] 35 % (05/01 1000) HEMODYNAMICS:   VENTILATOR SETTINGS: Vent Mode:  [-] PRVC FiO2 (%):  [35 %] 35 % Set Rate:  [18 bmp] 18 bmp Vt Set:  [450 mL] 450 mL PEEP:  [5 cmH20] 5 cmH20 Plateau Pressure:  [15 cmH20-20 cmH20] 15 cmH20 INTAKE / OUTPUT:  Intake/Output Summary (Last 24 hours) at 05/16/14 1126 Last data filed at 05/16/14 0950  Gross per 24 hour  Intake    680 ml  Output     10 ml  Net    670 ml    PHYSICAL EXAMINATION:  GEN thin, disheveled, critically ill appearing   HEENT pink conjunctivae, PERRL, dry oral mucosa, orally intubated   NECK supple  No masses   CARD regular rate  no murmur  no JVD   ABD denies tenderness  soft  normal BS   LYMPH negative neck   EXTR negative cyanosis/clubbing, positive edema, b/l UEs.   SKIN No rashes, skin turgor decreased   NEURO More awake today, following simple commands.    PSYCH More awake today   RESP postive use of accessory muscles  wheezing  rhonchi  on vent    LABS:  CBC  Recent Labs Lab 05/13/14 0417 05/14/14 0411 05/15/14 0428  WBC 13.6* 13.9* 13.9*  HGB 10.9* 9.0* 8.7*  HCT 34.4* 28.6* 26.8*  PLT 72* 53* 50*   Coag's No results for input(s): APTT, INR in the last 168 hours. BMET  Recent Labs Lab 05/13/14 0417 05/14/14 0411 05/15/14 0428  CO2 20* 23 26  BUN 59* 60* 54*  CREATININE  3.50* 3.51* 3.18*   Electrolytes  Recent Labs Lab 05/13/14 0417 05/14/14 0411 05/15/14 0428 05/16/14 0400  CALCIUM 9.1 8.5* 8.3*  --   MG  --   --   --  1.6*  PHOS  --   --   --  1.6*   Sepsis Markers No results for input(s): LATICACIDVEN, PROCALCITON, O2SATVEN in the last 168 hours. ABG No results for input(s): PHART, PCO2ART, PO2ART in the last 168 hours. Liver Enzymes No results for input(s): AST, ALT, ALKPHOS, BILITOT, ALBUMIN in the last 168 hours. Cardiac Enzymes No results for input(s): TROPONINI, PROBNP in the last 168 hours. Glucose  Recent Labs Lab 05/15/14 2338 05/16/14 0623  GLUCAP 120* 117*    Imaging No results found.   ASSESSMENT / PLAN: 74 yo female with h.o smoking admitted for acute encephalopathy and acute hypoxic and hypercpanic resp failure from COPD exacerbation from acute RLL pneumonia complicated by acute renal failure.   1.respiratory failure -continue full vent support -follow up abg and cxr as needed -wean fio2 and peep as tolerated - will attempt sbt today with sedation vacation  2.encephlopathy- Altered mental status probably from sepsis and underlying pneumonia. - improving following simple commands today -sedation as needed while on vent  3.right lower lobe  pneumonia -emepric abx s/p bronch BAL sent for cultures  4. Acute kidney injury, possible due to ATN. worsening. cont NS iv, f/u BMP. -patient started on HD   Vilinda Boehringer, MD [facility] Pager [phone] (Please enter 7-digits)

## 2014-05-16 NOTE — Discharge Summary (Signed)
PATIENT NAME:  Caitlyn Wang, Caitlyn Wang MR#:  [id number] DATE OF BIRTH:  [birth year]  DATE OF ADMISSION:  05/08/2014 DATE OF DISCHARGE:  05/14/2014  PRIMARY CARE PHYSICIAN: Nonlocal physician at [facility].   Discharge summary for possible transfer to [place]: Altered mental status.   CONSULTATIONS: Pulmonary Dr. Mortimer Fries, nephrology Dr. Holley Raring, infectious disease Dr. Ola Spurr.   HOSPITAL COURSE: The patient is a 74 year old Caucasian female with a history of osteopenia and colon cancer, status post chemotherapy, who was sent to ED due to altered mental status for 2 days. In the ED, the patient's chest x-ray showed right lower lobe pneumonia. The patient was hypotensive with blood pressure at 85/46. The patient received IV fluid bolus and broad-spectrum antibiotics with Zosyn, Levaquin and vancomycin. The patient also was noted to have an elevated creatinine at 2.9. Troponin was 0.46. For detailed history and physical examinations, please refer to the admission note dictated by Dr. Margaretmary Eddy.   1. Altered mental status with acute encephalopathy due to sepsis and pneumonia. The patient was lethargic and confused. It was hard to wake up her. She was on oxygen by nasal cannula 3 liters and then, she was intubated due to respiratory failure.  2. Acute respiratory failure with hypercapnia. The patient was noticed more lethargic, unresponsive on 05/11/2014. ABG showed pH of 6.9, pCO2 128, pO2 81. After discussed with the patient the POA, the patient was intubated and placed on ventilation. The patient has been on ventilation for the past 4 days. In addition, the patient has been treated with nebulizer and Solu-Medrol. The patient got a bronchoscopy, which showed mucous plug. The repeated chest x-ray showed right medial and a lower lobe collapse. Repeated chest x-ray today shows stable right basilar opacity, concerning for pneumonia or atelectasis with associated pleural effusion.  3. Septic shock  due to pneumonia. The patient was on Levophed drip, but off Levophed drip for the past 2 days. The patient has been treated with Zosyn, Levaquin and vancomycin. Blood culture is negative so far. The patient has leukocytosis with WBC 13.9.  4. Right lower lobe pneumonia as mentioned above, the patient has been treated with Zosyn, Levaquin and vancomycin. Dr. Ola Spurr suggested discontinuing vancomycin and continuing Zosyn and Levaquin. The patient had a bronchoscopy. Bronchoscopy wash liquid showed yeast. The patient was started on Diflucan.  5. Acute  renal failure, possibly due to acute tubular necrosis. The patient's renal function has been worsening since admission. She has minimal urine output for the past 2 days, creatinine increased to 3.5, BUN increased to 60. The patient was started on hemodialysis yesterday and she will get hemodialysis today, according to Dr. Elwyn Lade suggestion.  6. Elevated troponin. The patient had elevated troponin which is possibly due to above-mentioned medical problems. The patient was on heparin drip for 48 hours according to Dr. Bethanne Ginger suggestion. The patient was off heparin drip for the past 3 days.  7. Atrial fibrillation with rapid ventricular response. The patient developed atrial fibrillation with rapid ventricular response last night and was started on Cardizem drip. Heart rate is better controlled. We will change to p.o. Cardizem and follow up with cardiologist, Dr. Ubaldo Glassing.  8. Thrombocytopenia. The patient's platelets have been declining since admission leg, was 177 on admission. Platelets were 177,000 on admission date, and now decreased to 53. The patient is off heparin drip.  9. Anemia of chronic kidney disease. 10. Currently, the patient is on ventilation with FiO2 35% and PEEP at 8. The patient  is off Levophed drip and Cardizem drip. He is still in critical condition and high risk for cardiac and pulmonary arrest. The patient POA request transfer to [facility] on 05/11/2014. I contact with intensivist, Dr. Koleen Nimrod in [facility].  There is no Intensive Care Unit bed available. In addition, the patient was on high oxygen and high PEEP for the past few days. She was not safe to be transported. Since the patient's FiO2 and PEEP is better, I called the transfer center this morning.  Dr. Hans Eden fellow called back. He accepted the patient. Hopefully, there is a bed available for the patient today.   CURRENT DIAGNOSES:  1. Altered mental status due to sepsis and pneumonia.  2. Acute respiratory failure with hypercapnia.  3. Septic shock and right lower lobe pneumonia.  4. Acute renal failure, now on hemodialysis.  5. Elevated troponin due to demand ischemia.  6. Atrial fibrillation with rapid ventricular response.  7. History of cancer status post surgery and chemotherapy.  8. Thrombocytopenia.  9. History of hypertension, osteopenia.  ____________________________ Demetrios Loll, MD qc:AT D: 05/14/2014 10:58:07 ET T: 05/14/2014 11:23:47 ET JOB#: [id number]  cc: Demetrios Loll, MD, <Dictator> Demetrios Loll MD ELECTRONICALLY SIGNED 05/14/2014 13:44

## 2014-05-16 NOTE — Consult Note (Signed)
   Present Illness 74 year old female with history of osteopenia who was admitted with increasing shortness of breath fatigue and altered mental status.  In the emergency room she was noted to have  acute renal insufficiency with a serum creatinine of 2.9.  Chest x-ray showed right-sided pneumonia.  She is doing somewhat better since admission.  Consultation was called secondary to mildly elevated serum troponin.  Troponin peak was 0.46 [place] far.  Patient is a somewhat difficult historian and is somewhat somnolent.  She denies chest pain.  She denies any back or arm pain.  She complains of some shortness of breath.  Patient was relatively hypotensive on admission felt to be mildly septic.  She has improved hemodynamically and thus far not required iv inotropes.   Echocardiogram revealed preserved left ventricular function.   Physical Exam:  GEN critically ill appearing   HEENT PERRL   NECK No masses   RESP no use of accessory muscles  rhonchi  crackles   CARD Regular rate and rhythm   ABD denies tenderness   LYMPH negative neck   EXTR negative cyanosis/clubbing, negative edema   SKIN normal to palpation, No ulcers   NEURO cranial nerves intact, motor/sensory function intact   PSYCH poor insight, lethargic   Review of Systems:  Subjective/Chief Complaint Altered mental status   General: Fatigue  Weakness   Skin: No Complaints   ENT: No Complaints   Eyes: No Complaints   Neck: No Complaints   Respiratory: Short of breath   Cardiovascular: No Complaints   Gastrointestinal: No Complaints   Genitourinary: No Complaints   Vascular: No Complaints   Musculoskeletal: No Complaints   Neurologic: No Complaints   Hematologic: No Complaints   Endocrine: No Complaints   Psychiatric: No Complaints   Review of Systems: All other systems were reviewed and found to be negative   Medications/Allergies Reviewed Medications/Allergies reviewed   EKG:  EKG NSR   Abnormal  NSSTTW changes    Allergy Status Unknown:    Impression 74 year old female with history of weakness fatigue and shortness of breath presented to the emergency room with altered mental status felt to be septic.  Chest x-ray revealed probable right lobe pneumonia.  She was relatively hypotensive and had transient acute on chronic renal insufficiency.  She has a mild serum troponin elevation is 0.46.  She is currently not complaining of chest pain but is a difficult historian.  Echocardiogram reveals preserved left ventricular function.  There is no regional wall motion abnormality.  Elevated serum troponin is likely secondary to demand ischemia.  Patient is currently on antibiotics and oxygen for her sepsis and pneumonia.  Would  continue to treat him with antibiotics.   Plan 1. Continue with antibiotics and areful hydration 2. Continue heparin for now. Hold beta blockers due to low blood pressure 3. asa 4. Will follow sympotms, caardiac markers and ekg as she recovers from sepsis. Consider further invasive vs non invasive cardiac workup pending course.   Electronic Signatures: Teodoro Spray (MD)  (Signed 24-Apr-16 16:27)  Authored: General Aspect/Present Illness, History and Physical Exam, Review of System, EKG , Allergies, Impression/Plan   Last Updated: 24-Apr-16 16:27 by Teodoro Spray (MD)

## 2014-05-16 NOTE — Consult Note (Addendum)
PATIENT NAME:  Caitlyn Wang, Caitlyn Wang MR#:  [id number] DATE OF BIRTH:  Jun 12, 1940  DATE OF CONSULTATION:  05/13/2014  REFERRING PHYSICIAN:   CONSULTING PHYSICIAN:  Cheral Marker. Ola Spurr, MD  REQUESTING PHYSICIAN:  Dr. Bridgett Larsson   REASON FOR CONSULTATION: Pneumonia and fungus on sputum BAL.   HISTORY OF PRESENT ILLNESS: This is a 74 year old female who is in the intensive care unit and intubated. History is obtained from review of the chart and discussion with medical staff. No family was at bedside.  She was admitted April 23 with progressive lethargy. She had also become dehydrated and had developed renal failure. She was found to have a pneumonia on x-ray. Initially she was treated with IV antibiotics but decompensated and had to be intubated. She since had a BAL with extensive secretions noted. She has been on broad-spectrum antibiotics.  BAL culture is only positive for yeast at this point. We are consulted for further antibiotic management.   PAST MEDICAL HISTORY:  Osteopenia, colon cancer status post chemotherapy, although this is unclear when this was.   PAST SURGICAL HISTORY: Colon surgery, collar bone surgery, neck surgery, brain aneurysm status post clipping.   SOCIAL HISTORY: She lives with a caregiver and power of attorney.   She smokes 2-3 packs per day and has over a 100 pack year history. She drinks alcohol socially. No other illicit drug use.   FAMILY HISTORY: Unobtainable.   ALLERGIES: No known drug allergies.   REVIEW OF SYSTEMS: Unable to be obtained.   ANTIBIOTICS SINCE ADMISSION: Include levofloxacin April 24, Zosyn April 23 through current, vancomycin April 24 through current, and Diflucan.   PHYSICAL EXAMINATION: VITAL SIGNS: Temperature 99.2, pulse 86, blood pressure 135/71, respirations 18, saturation 100% on ventilator on 35% FiO2.  GENERAL: She is critically ill-appearing, intubated.  HEENT: Pupils are reactive. Sclerae are anicteric. Oropharynx  has an ET tube in place.   NECK: Supple. No anterior cervical, posterior cervical, or supraclavicular lymphadenopathy.  HEART: Regular.  LUNGS: Have coarse breath sounds with wheezing bilaterally.  ABDOMEN: Soft, nontender, and nondistended.  VASCULAR: She has what looks like a Vas-Cath in her right groin. She has triple lumen in her left neck.  NEUROLOGIC: She is sedated.   LABORATORY DATA: Microbiology is reviewed. Blood cultures April 23 negative x2. Urine culture negative.  Bronchial wash April 27 grew out heavy growth yeast, ID to follow, moderate white blood cells were seen; no other organisms noted. White count on April 23 was 12.7, currently 13.6, hemoglobin 10.9, platelets 72,000. Renal function shows a creatinine of 3.50, BUN 59.   IMAGING: Chest x-ray on April 23 showed right lower lobe airspace opacity suspicious for pneumonia. Follow-up chest x-ray April 27 post bronchoscopy shows interval aeration of the right upper lobe with indistinct pulmonary vasculature in this vicinity, continued collapse of the right middle lobe and right lower lobe,  distinct airspace opacity at the left lung base could be from edema or pneumonia.   IMPRESSION: A 74 year old, very heavy tobacco use with over 100 pack years, admitted with pneumonia April 23 when she was found lethargic. She also had acute renal failure. She was covered with broad spectrum antibiotics and on April 27 underwent bronchoscopy.  Bacterial cultures are negative from that but fungal cultures are growing yeast.  She is currently on vancomycin, Zosyn, and levofloxacin.  The bronchoscopy did show a lot of purulence. I think she is at high risk for lung cancer but it does not sound like any intrabronchial lesions were noted  on bronchoscopy. She has not had a CT of her chest.   RECOMMENDATIONS:   1. Discontinue vancomycin.  2. She may have a yeast tracheitis, but Candida does not usually cause an aggressive pneumonia or invasive pulmonary disease. It would be okay to  give a few days of fluconazole to treat tracheitis, but would not continue this more than 5 days.  3. I would continue the Zosyn and levofloxacin at this point, but could probably back off to just Zosyn once she has finished a course for atypical infection.  4. Consider CT of the chest if bronchoscopy would not be adequate to exclude cancer.   Thank you for the consult. I will be glad to follow with you.    ____________________________ Cheral Marker. Ola Spurr, MD dpf:tr D: 05/13/2014 16:11:28 ET T: 05/13/2014 16:58:41 ET JOB#: [id number]  cc: Cheral Marker. Ola Spurr, MD, <Dictator> Gwenyth Dingee Ola Spurr MD ELECTRONICALLY SIGNED 05/18/2014 10:25

## 2014-05-16 NOTE — Consult Note (Signed)
CHIEF COMPLAINT and HISTORY:  Subjective/Chief Complaint ARF/MSOF   History of Present Illness Patient is a critically ill 74 yo WF who has been in the hospital and criticall ill for a week now.  She has continued to decline and has now developed ARF with MSOF.  Remains on the vent.  Her family desires aggressive care, and we are asked to place a catheter to start HD.  She is intubated and sedated and provides no history   PAST MEDICAL/SURGICAL HISTORY:  Past Medical History:   osteopenia:    scoliosis:    etoh abuse:    smoker:    left shoulder surgery:   ALLERGIES:  Allergies:  No Known Allergies:   HOME MEDICATIONS:  Home Medications: Medication Instructions Status  gabapentin 300 mg oral capsule 2  orally 3 times a day  Active  atenolol 50 mg oral tablet 1  orally once a day  Active  oxycodone 5 mg oral tablet 6  orally once a day  Active  oxycodone 5 mg oral tablet 4  orally once a day  Active  oxycodone 40 mg oral tablet, extended release 1  orally 2 times a day  Active  Aspir 81 1   once a day  Active  Prilosec OTC 1   once a day  Active  Vitamin D 200iu 1   once a day  Active  atorvastatin 10 mg oral tablet 1  orally once a day  Active   Family and Social History:  Family History Coronary Artery Disease  Hypertension   Social History positive  tobacco, positive ETOH, negative Illicit drugs   + Tobacco Current (within 1 year)  heavy, 100 pack year history   Place of Living Home   Review of Systems:  ROS Pt not able to provide ROS  secondary to MSOF on the vent   Physical Exam:  GEN well developed, well nourished, disheveled, critically ill appearing   HEENT pink conjunctivae, poor dentition   NECK No masses  trachea midline   RESP wheezing  rhonchi  on vent   CARD regular rate  no JVD   VASCULAR ACCESS none   ABD denies tenderness  soft   GU foley catheter in place  clear yellow urine draining   LYMPH negative neck, negative axillae   EXTR  positive edema   SKIN No ulcers, skin turgor poor   NEURO sedated on the vent, unable to assess   PSYCH sedated   LABS:  Laboratory Results: LabObservation:    24-Apr-16 07:00, Echo Doppler  OBSERVATION   Reason for Test    28-Apr-16 15:00, [place] Color Doppler Upper Extrem Bilat (Arms)  OBSERVATION   Reason for Test Swelling  Hepatic:    23-Apr-16 16:29, Comprehensive Metabolic Panel  Bilirubin, Total 0.2  0.3-1.2  NOTE: New Reference Range   03/23/14  Alkaline Phosphatase 70  38-126  NOTE: New Reference Range   03/23/14  SGPT (ALT) 12  14-54  NOTE: New Reference Range   03/23/14  SGOT (AST) 22  15-41  NOTE: New Reference Range   03/23/14  Total Protein, Serum 6.8  6.5-8.1  NOTE: New Reference Range   03/23/14  Albumin, Serum 3.2  3.5-5.0  NOTE: New reference range   03/23/14  TDMs:    27-Apr-16 04:40, Vancomycin, Trough LAB  Vancomycin, Trough LAB 20  10-20  NOTE: New Reference Range   03/23/14    28-Apr-16 04:17, Vancomycin, Trough LAB  Vancomycin, Trough LAB 18  10-20  NOTE: New Reference Range   03/23/14  Routine Micro:    23-Apr-16 16:29, Blood Culture  Micro Text Report   BLOOD CULTURE    COMMENT                   NO GROWTH AEROBICALLY/ANAEROBICALLY IN 5 DAYS     ANTIBIOTIC  Culture Comment   NO GROWTH AEROBICALLY/ANAEROBICALLY IN 5 DAYS   Result(s) reported on 13 May 2014 at 04:00PM.    23-Apr-16 16:37, Blood Culture  Micro Text Report   BLOOD CULTURE    COMMENT                   NO GROWTH AEROBICALLY/ANAEROBICALLY IN 5 DAYS     ANTIBIOTIC  Culture Comment   NO GROWTH AEROBICALLY/ANAEROBICALLY IN 5 DAYS   Result(s) reported on 13 May 2014 at 04:00PM.    23-Apr-16 17:01, Urine Culture  Micro Text Report   URINE CULTURE    COMMENT                   NO GROWTH IN 36 HOURS     ANTIBIOTIC  Specimen Source   IN/OUT CATH  Culture Comment   NO GROWTH IN 36 HOURS   Result(s) reported on 10 May 2014 at 08:33AM.    27-Apr-16 08:55, Bronchial  Wash Culture Aerobic/GS  Organism Name YEAST  Organism Quantity   HEAVY GROWTH  Micro Text Report   BRONCHIAL [place] CULTURE    ORGANISM 1                HEAVY GROWTH YEAST    COMMENT                   ID TO FOLLOW    COMMENT                   ONCE BETTER GROWTH    GRAM STAIN                FAIR SPECIMEN-70-80% WBC    GRAM STAIN                MODERATE WHITE BLOOD CELLS    GRAM STAIN                NO ORGANISMS SEEN     ANTIBIOTIC  Organism 1   HEAVY GROWTH YEAST  Culture Comment   ID TO FOLLOW  Culture Comment .   ONCE BETTER GROWTH  Gram Stain 1   FAIR SPECIMEN-70-80% WBC  Gram Stain 2   MODERATE WHITE BLOOD CELLS  Gram Stain 3   NO ORGANISMS SEEN   Result(s) reported on 13 May 2014 at 02:45PM.  Lab:    23-Apr-16 16:40, ABG  pH (ABG) 7.330  7.350-7.450  NOTE: New Reference Range  08/08/13  PCO2 48  32-48  NOTE: New Reference Range  08/25/13  PO2 43  83-108  NOTE: New Reference Range  08/08/13  FiO2 21  Base Excess -1  -3-3  NOTE: New Reference Range  08/25/13  HCO3 25.3  21.0-28.0  NOTE: New Reference Range  08/08/13  O2 Saturation 83.3  O2 Device ra  Specimen Site (ABG)   RT BRACHIAL  Specimen Type (ABG) ARTERIAL  Patient Temp (ABG) 37.0  Result(s) reported on 08 May 2014 at 04:49PM.    26-Apr-16 08:50, ABG  pH (ABG) < 6.900  7.350-7.450  NOTE: New Reference Range  08/08/13  PCO2 > 128  32-48  NOTE: New  Reference Range  08/25/13  PO2 81  83-108  NOTE: New Reference Range  08/08/13  FiO2 100  O2 Saturation 95.8  O2 Device   NON RE-BRE  Specimen Site (ABG)   LT BRACHIAL  Specimen Type (ABG) ARTERIAL  Patient Temp (ABG) 37.0    26-Apr-16 10:50, ABG  pH (ABG) 7.220  7.350-7.450  NOTE: New Reference Range  08/08/13  PCO2 40  32-48  NOTE: New Reference Range  08/25/13  PO2 48  83-108  NOTE: New Reference Range  08/08/13  FiO2 100  Base Excess -11  -3-3  NOTE: New Reference Range  08/25/13  HCO3 16.4  21.0-28.0  NOTE: New Reference  Range  08/08/13  O2 Saturation 94.0  O2 Device vent  Specimen Site (ABG)   RT RADIAL  Patient Temp (ABG) 37.0  Vt 450  PEEP 8.0  Result(s) reported on 11 May 2014 at 11:06AM.    28-Apr-16 08:50, ABG  pH (ABG) 7.220  7.350-7.450  NOTE: New Reference Range  08/08/13  PCO2 42  32-48  NOTE: New Reference Range  08/25/13  PO2 61  83-108  NOTE: New Reference Range  08/08/13  FiO2 35  Base Excess -10  -3-3  NOTE: New Reference Range  08/25/13  HCO3 17.2  21.0-28.0  NOTE: New Reference Range  08/08/13  O2 Saturation 92.7  O2 Device servo i  Specimen Site (ABG)   RT RADIAL  Specimen Type (ABG) ARTERIAL  Patient Temp (ABG) 37.0  Mode   ASSIST CONTROL  Vt 450  PEEP 8.0  Mechanical Rate 18  Result(s) reported on 13 May 2014 at 08:59AM.  Cardiology:    23-Apr-16 16:00, ED ECG  Ventricular Rate 80  Atrial Rate 80  P-R Interval 178  QRS Duration 74  QT 388  QTc 447  P Axis 46  R Axis -119  T Axis -53  ECG interpretation   Sinus rhythm with occasional Premature ventricular complexes  Right superior axis deviation  Anterior infarct , age undetermined  Abnormal ECG  When compared with ECG of 11-Feb-2002 14:48,  Premature ventricular complexes are now Present  QRS axis Shifted left  Anterior infarct is now Present  T wave inversion now evident in Inferior leads  T wave inversion now evident in Anterior leads  ----------unconfirmed----------  Confirmed by OVERREAD, NOT ([id number]), editor PEARSON, BARBARA ([id number]) on 05/11/2014 1:13:12 PM  ED ECG     24-Apr-16 07:00, Echo Doppler  Echo Doppler   REASON FOR EXAM:      COMMENTS:       PROCEDURE: ECH - ECHO DOPPLER COMPLETE(TRANSTHOR)  - May 09 2014  7:00AM     RESULT: Echocardiogram Report    Patient Name:   Caitlyn Wang Date of Exam: 05/09/2014  Medical Rec #:  [id number]           Custom1:  Date of Birth:  1940-12-21        Height:       58.0 in  Patient Age:    28 years         Weight:       110.0 lb  Patient Gender: F                 BSA:          1.41 m??    Indications: MI  Sonographer:    Arville Go RDCS  Referring Phys: Nicholes Mango    Summary:   1. Left ventricular ejection fraction, by visual  estimation, is 55 to   60%.   2. Normal global left ventricular systolic function.   3. Mild tricuspid regurgitation.  2D AND M-MODE MEASUREMENTS (normal ranges within parentheses):  Left Ventricle:          Normal  IVSd (2D):      0.99 cm (0.7-1.1)  LVPWd (2D):     1.09 cm (0.7-1.1) Aorta/LA:                  Normal  LVIDd (2D):     3.70 cm (3.4-5.7) Aortic Root (2D): 2.70 cm (2.4-3.7)  LVIDs (2D):     2.64 cm           Left Atrium(2D): 3.80 cm (1.9-4.0)  LV FS (2D):     28.6 %   (>25%)  LV EF (2D):     56.0 %   (>50%)                                    Right Ventricle:                                    RVd (2D):  LV DIASTOLIC FUNCTION:  MV Peak E: 1.02 m/s E/e' Ratio: 8.60  MV Peak A: 0.94 m/s Decel Time: 290 msec  E/A Ratio: 1.08  SPECTRAL DOPPLER ANALYSIS (where applicable):  Mitral Valve:  MV P1/2 Time: 84.10 msec  MV Area, PHT: 2.62 cm??  Aortic Valve: AoV Max Vel: 1.34 m/s AoV Peak PG: 7.2 mmHg AoV Mean PG:  LVOT Vmax: 1.09 m/s LVOT VTI:  LVOT Diameter: 2.00 cm  AoV Area, Vmax: 2.56 cm?? AoV Area, VTI:  AoV Area, Vmn:  Pulmonic Valve:  PV Max Velocity: 0.96 m/s PV Max PG: 3.7 mmHg PV Mean PG:  PHYSICIAN INTERPRETATION:  Left Ventricle: The left ventricular internal cavity size was normal. LV   septal wall thickness was normal. LV posterior wall thickness was normal.   Global LV systolic function was normal. Left ventricular ejection   fraction, by visual estimation, is 55 to 60%.  Right Ventricle: The right ventricular size is mildly enlarged. Global RV   systolic function is normal.  Left Atrium: The left atrium is normal in size.  Mitral Valve: The mitral valve is not well seen.  Tricuspid Valve: The tricuspid valve is not well seen. Mild tricuspid   regurgitation is  visualized.  Aortic Valve: The aortic valve was not well seen. The aortic valve is   tricuspid. Trivial aortic valve regurgitation is seen.    [place] MD  Electronically signed by [id number] Bill MD  Signature Date/Time: 05/09/2014/10:45:54 AM    *** Final ***    IMPRESSION: .        Verified By: Teodoro Spray, M.D., MD    24-Apr-16 10:08, ECG  Ventricular Rate 67  Atrial Rate 67  P-R Interval 174  QRS Duration 76  QT 414  QTc 437  P Axis 54  R Axis -55  T Axis 1  ECG interpretation   Normal sinus rhythm  Left axis deviation  Low voltage QRS  Abnormal ECG  When compared with ECG of 08-May-2014 16:00,  Non-specific change in ST segment in Inferior leads  Nonspecific T wave abnormality now evident in Lateral leads  Confirmed by Cool, SAM ([id number]) on 05/10/2014 4:49:20 PM  Overreader: Hipolito Bayley  ECG   Routine Chem:    23-Apr-16 16:29, Comprehensive Metabolic Panel  Glucose, Serum 113  65-99  NOTE: New Reference Range   03/23/14  BUN 51  6-20  NOTE: New Reference Range   03/23/14  Creatinine (comp) 2.90  0.44-1.00  NOTE: New Reference Range   03/23/14  Sodium, Serum 131  135-145  NOTE: New Reference Range   03/23/14  Potassium, Serum 4.5  3.5-5.1  NOTE: New Reference Range   03/23/14  Chloride, Serum 90  101-111  NOTE: New Reference Range   03/23/14  CO2, Serum 28  22-32  NOTE: New Reference Range   03/23/14  Calcium (Total), Serum 8.7  8.9-10.3  NOTE: New Reference Range   03/23/14  eGFR (African American) 18  eGFR (Non-African American) 16  eGFR values <18m/min/1.73 m2 may be an indication of chronic  kidney disease (CKD).  Calculated eGFR is useful in patients with stable renal function.  The eGFR calculation will not be reliable in acutely ill patients  when serum creatinine is changing rapidly. It is not useful in  patients on dialysis. The eGFR calculation may not be applicable  to patients at the low and high  extremes of body sizes, pregnant  women, and vegetarians.  Anion Gap 13    23-Apr-16 16:29, Lactic Acid  Venous  Lactic Acid  Venous 1.6  0.5-2.0  NOTE: New Reference Range:   03/23/14    23-Apr-16 16:29, Magnesium, Serum  Magnesium, Serum 1.2  1.7-2.4  THERAPEUTIC RANGE: 4-7 mg/dL  TOXIC: > 10 mg/dL   -----------------------  NOTE: New Reference Range   03/23/14    23-Apr-16 16:29, Phosphorus, Serum  Phosphorus, Serum 7.0  2.5-4.6  NOTE: New Reference Range   03/23/14    23-Apr-16 16:29, Troponin I  Result Comment   - TROPONIN - C/BRANDY DAVIS AT 1709 ON   - 05/08/14 BY [facility]   - READ-BACK PERFORMED   Result(s) reported on 08 May 2014 at 04:57PM.    23-Apr-16 19:16, Troponin I  Result Comment   - TROPONIN   - PREVIOUSLY CALLED AT 1709 05/08/14.PMH   Result(s) reported on 08 May 2014 at 07:52PM.    24-Apr-16 00:34, Troponin I  Result Comment   - TROPONIN   - PREVIOUSLY CALLED AT 1709 05/08/14.PMH   Result(s) reported on 09 May 2014 at 01:12AM.    24-Apr-16 073:41 Basic Metabolic Panel (w/Total Calcium)  Glucose, Serum 146  65-99  NOTE: New Reference Range   03/23/14  BUN 43  6-20  NOTE: New Reference Range   03/23/14  Creatinine (comp) 1.90  0.44-1.00  NOTE: New Reference Range   03/23/14  Sodium, Serum 136  135-145  NOTE: New Reference Range   03/23/14  Potassium, Serum 4.4  3.5-5.1  NOTE: New Reference Range   03/23/14  Chloride, Serum 110  101-111  NOTE: New Reference Range   03/23/14  CO2, Serum 20  22-32  NOTE: New Reference Range   03/23/14  Calcium (Total), Serum 7.9  8.9-10.3  NOTE: New Reference Range   03/23/14  Anion Gap 6  eGFR (African American) 30  eGFR (Non-African American) 26  eGFR values <625mmin/1.73 m2 may be an indication of chronic  kidney disease (CKD).  Calculated eGFR is useful in patients with stable renal function.  The eGFR calculation will not be reliable in acutely ill patients  when serum creatinine is changing  rapidly. It is not useful in  patients on dialysis. The eGFR  calculation may not be applicable  to patients at the low and high extremes of body sizes, pregnant  women, and vegetarians.    24-Apr-16 08:27, Lipid Profile [facility])  Cholesterol, Serum 101  0-200  NOTE: New Reference Range   03/23/14  Triglycerides, Serum 80  0-149  NOTE: New Reference Range   03/23/14  HDL (INHOUSE) 41  40-1000  NOTE: New Reference Range:   03/23/14  VLDL Cholesterol Calculated 16  0-40  NOTE: New Reference Range   03/23/14  LDL Cholesterol Calculated 44  0-99  NOTE: New Reference Range:   03/23/14    24-Apr-16 08:27, Magnesium, Serum  Magnesium, Serum 2.1  1.7-2.4  THERAPEUTIC RANGE: 4-7 mg/dL  TOXIC: > 10 mg/dL   -----------------------  NOTE: New Reference Range   03/23/14    24-Apr-16 08:27, Phosphorus, Serum  Phosphorus, Serum 3.5  2.5-4.6  NOTE: New Reference Range   03/23/14    24-Apr-16 22:36, Heparin (Anti-Xa) Assay  Result Comment   ANTI-XA - RESULTS VERIFIED BY REPEAT TESTING.   Result(s) reported on 10 May 2014 at 02:27AM.    25-Apr-16 95:18, Basic Metabolic Panel (w/Total Calcium)  Glucose, Serum 159  65-99  NOTE: New Reference Range   03/23/14  BUN 38  6-20  NOTE: New Reference Range   03/23/14  Creatinine (comp) 1.52  0.44-1.00  NOTE: New Reference Range   03/23/14  Sodium, Serum 139  135-145  NOTE: New Reference Range   03/23/14  Potassium, Serum 3.6  3.5-5.1  NOTE: New Reference Range   03/23/14  Chloride, Serum 114  101-111  NOTE: New Reference Range   03/23/14  CO2, Serum 22  22-32  NOTE: New Reference Range   03/23/14  Calcium (Total), Serum 8.1  8.9-10.3  NOTE: New Reference Range   03/23/14  Anion Gap 3  eGFR (African American) 39  eGFR (Non-African American) 34  eGFR values <58m/min/1.73 m2 may be an indication of chronic  kidney disease (CKD).  Calculated eGFR is useful in patients with stable renal function.  The eGFR calculation  will not be reliable in acutely ill patients  when serum creatinine is changing rapidly. It is not useful in  patients on dialysis. The eGFR calculation may not be applicable  to patients at the low and high extremes of body sizes, pregnant  women, and vegetarians.    25-Apr-16 04:55, Magnesium, Serum  Magnesium, Serum 1.9  1.7-2.4  THERAPEUTIC RANGE: 4-7 mg/dL  TOXIC: > 10 mg/dL   -----------------------  NOTE: New Reference Range   03/23/14    26-Apr-16 084:16 Basic Metabolic Panel (w/Total Calcium)  Glucose, Serum 165  65-99  NOTE: New Reference Range   03/23/14  BUN 39  6-20  NOTE: New Reference Range   03/23/14  Creatinine (comp) 2.11  0.44-1.00  NOTE: New Reference Range   03/23/14  Sodium, Serum 140  135-145  NOTE: New Reference Range   03/23/14  Potassium, Serum 5.1  3.5-5.1  NOTE: New Reference Range   03/23/14  Chloride, Serum 117  101-111  NOTE: New Reference Range   03/23/14  CO2, Serum 22  22-32  NOTE: New Reference Range   03/23/14  Calcium (Total), Serum 8.6  8.9-10.3  NOTE: New Reference Range   03/23/14  Anion Gap 1  eGFR (African American) 26  eGFR (Non-African American) 23  eGFR values <629mmin/1.73 m2 may be an indication of chronic  kidney disease (CKD).  Calculated eGFR is useful in patients with stable renal function.  The  eGFR calculation will not be reliable in acutely ill patients  when serum creatinine is changing rapidly. It is not useful in  patients on dialysis. The eGFR calculation may not be applicable  to patients at the low and high extremes of body sizes, pregnant  women, and vegetarians.    26-Apr-16 06:38, CBC Profile  Result Comment   HGB/HCT - RESULTS VERIFIED BY REPEAT TESTING.   Result(s) reported on 11 May 2014 at 06:57AM.    26-Apr-16 06:38, Magnesium, Serum  Magnesium, Serum 2.0  1.7-2.4  THERAPEUTIC RANGE: 4-7 mg/dL  TOXIC: > 10 mg/dL   -----------------------  NOTE: New Reference Range   03/23/14     26-Apr-16 06:38, Phosphorus, Serum  Phosphorus, Serum 5.4  2.5-4.6  NOTE: New Reference Range   03/23/14    26-Apr-16 08:50, ABG  Result Comment   - NOTIFIED OF CRITICAL VALUE   - READ-BACK PROCESS PERFORMED.   - dr Bridgett Larsson at [phone], 05/11/2014   Result(s) reported on 11 May 2014 at 08:52AM.    26-Apr-16 38:75, Basic Metabolic Panel (w/Total Calcium)  Glucose, Serum 142  65-99  NOTE: New Reference Range   03/23/14  BUN 41  6-20  NOTE: New Reference Range   03/23/14  Creatinine (comp) 2.50  0.44-1.00  NOTE: New Reference Range   03/23/14  Sodium, Serum 142  135-145  NOTE: New Reference Range   03/23/14  Potassium, Serum 3.3  3.5-5.1  NOTE: New Reference Range   03/23/14  Chloride, Serum 117  101-111  NOTE: New Reference Range   03/23/14  CO2, Serum 18  22-32  NOTE: New Reference Range   03/23/14  Calcium (Total), Serum 8.6  8.9-10.3  NOTE: New Reference Range   03/23/14  Anion Gap 7  eGFR (African American) 22  eGFR (Non-African American) 19  eGFR values <25m/min/1.73 m2 may be an indication of chronic  kidney disease (CKD).  Calculated eGFR is useful in patients with stable renal function.  The eGFR calculation will not be reliable in acutely ill patients  when serum creatinine is changing rapidly. It is not useful in  patients on dialysis. The eGFR calculation may not be applicable  to patients at the low and high extremes of body sizes, pregnant  women, and vegetarians.    27-Apr-16 064:33 Basic Metabolic Panel (w/Total Calcium)  Glucose, Serum 120  65-99  NOTE: New Reference Range   03/23/14  BUN 46  6-20  NOTE: New Reference Range   03/23/14  Creatinine (comp) 2.66  0.44-1.00  NOTE: New Reference Range   03/23/14  Sodium, Serum 139  135-145  NOTE: New Reference Range   03/23/14  Potassium, Serum 3.7  3.5-5.1  NOTE: New Reference Range   03/23/14  Chloride, Serum 115  101-111  NOTE: New Reference Range   03/23/14  CO2, Serum 19   22-32  NOTE: New Reference Range   03/23/14  Calcium (Total), Serum 8.7  8.9-10.3  NOTE: New Reference Range   03/23/14  Anion Gap 5  eGFR (African American) 20  eGFR (Non-African American) 17  eGFR values <628mmin/1.73 m2 may be an indication of chronic  kidney disease (CKD).  Calculated eGFR is useful in patients with stable renal function.  The eGFR calculation will not be reliable in acutely ill patients  when serum creatinine is changing rapidly. It is not useful in  patients on dialysis. The eGFR calculation may not be applicable  to patients at the low and high extremes of body sizes, pregnant  women, and vegetarians.    27-Apr-16 03:50, CBC Profile  Result Comment   LABS - This specimen was collected through an    - indwelling catheter or arterial line.   - A minimum of 658ms of blood was wasted prior     - to collecting the sample.  Interpret   - results with caution.  nrbc's - PATHOLOGIST TO REVIEW SMEAR. COMMENTS   - APPEAR ON REPORT WHEN COMPLETE.   Result(s) reported on 12 May 2014 at 05:51AM.    27-Apr-16 03:50, Magnesium, Serum  Magnesium, Serum 1.8  1.7-2.4  THERAPEUTIC RANGE: 4-7 mg/dL  TOXIC: > 10 mg/dL   -----------------------  NOTE: New Reference Range   03/23/14    27-Apr-16 03:50, Phosphorus, Serum  Phosphorus, Serum 2.4  2.5-4.6  NOTE: New Reference Range   03/23/14    27-Apr-16 03:50, Potassium, Serum  Result Comment   LABS - This specimen was collected through an    - indwelling catheter or arterial line.   - A minimum of 557m of blood was wasted prior     - to collecting the sample.  Interpret   - results with caution.   Result(s) reported on 12 May 2014 at 04:12AM.  Potassium, Serum -  3.5-5.1  NOTE: New Reference Range   03/23/14    27-Apr-16 04:40, Vancomycin, Trough LAB  Result Comment   LABS - This specimen was collected through an    - indwelling catheter or arterial line.   - A minimum of 58m90mof blood was wasted prior      - to collecting the sample.  Interpret   - results with caution.   Result(s) reported on 12 May 2014 at 05:25AM.    28-Apr-16 04:79:02asic Metabolic Panel (w/Total Calcium)  Glucose, Serum 112  65-99  NOTE: New Reference Range   03/23/14  BUN 59  6-20  NOTE: New Reference Range   03/23/14  Creatinine (comp) 3.50  0.44-1.00  NOTE: New Reference Range   03/23/14  Sodium, Serum 139  135-145  NOTE: New Reference Range   03/23/14  Potassium, Serum 4.2  3.5-5.1  NOTE: New Reference Range   03/23/14  Chloride, Serum 114  101-111  NOTE: New Reference Range   03/23/14  CO2, Serum 20  22-32  NOTE: New Reference Range   03/23/14  Calcium (Total), Serum 9.1  8.9-10.3  NOTE: New Reference Range   03/23/14  Anion Gap 5  eGFR (African American) 14  eGFR (Non-African American) 12  eGFR values <10m87mn/1.73 m2 may be an indication of chronic  kidney disease (CKD).  Calculated eGFR is useful in patients with stable renal function.  The eGFR calculation will not be reliable in acutely ill patients  when serum creatinine is changing rapidly. It is not useful in  patients on dialysis. The eGFR calculation may not be applicable  to patients at the low and high extremes of body sizes, pregnant  women, and vegetarians.    28-Apr-16 04:17, CBC Profile  Result Comment   LABS - This specimen was collected through an    - indwelling catheter or arterial line.   - A minimum of 58mls4m blood was wasted prior     - to collecting the sample.  Interpret   - results with caution.  CBC - SMEAR SCANNED   Result(s) reported on28 Apr 2016 at 07:42AM.    28-Apr-16 04:17, Vancomycin, Trough LAB  Result Comment   VANC - This specimen was collected through  an    - indwelling catheter or arterial line.   - A minimum of 14ms of blood was wasted prior     - to collecting the sample.  Interpret   - results with caution.   Result(s) reported on 13 May 2014 at 06:34AM.  Cardiac:    23-Apr-16 16:29,  CPK-MB, Serum  CPK-MB, Serum 13.3  0.5-5.0  NOTE: New Reference Range   03/23/14    23-Apr-16 16:29, Troponin I  Troponin I 0.46  0.00-0.03  0.03 ng/mL or less: NEGATIVE   Repeat testing in 3-6 hrs   if clinically indicated.  >0.05 ng/mL: POTENTIAL   MYOCARDIAL INJURY. Repeat   testing in 3-6 hrs if   clinically indicated.  NOTE: An increase or decrease   of 30% or more on serial   testing suggests a   clinically important change  NOTE: New Reference Range   03/23/14    23-Apr-16 19:16, CPK-MB, Serum  CPK-MB, Serum 14.5  0.5-5.0  NOTE: New Reference Range   03/23/14    23-Apr-16 19:16, Troponin I  Troponin I 0.50  0.00-0.03  0.03 ng/mL or less: NEGATIVE   Repeat testing in 3-6 hrs   if clinically indicated.  >0.05 ng/mL: POTENTIAL   MYOCARDIAL INJURY. Repeat   testing in 3-6 hrs if   clinically indicated.  NOTE: An increase or decrease   of 30% or more on serial   testing suggests a   clinically important change  NOTE: New Reference Range   03/23/14    24-Apr-16 00:34, CPK-MB, Serum  CPK-MB, Serum 15.2  0.5-5.0  NOTE: New Reference Range   03/23/14    24-Apr-16 00:34, Troponin I  Troponin I 0.46  0.00-0.03  0.03 ng/mL or less: NEGATIVE   Repeat testing in 3-6 hrs   if clinically indicated.  >0.05 ng/mL: POTENTIAL   MYOCARDIAL INJURY. Repeat   testing in 3-6 hrs if   clinically indicated.  NOTE: An increase or decrease   of 30% or more on serial   testing suggests a   clinically important change  NOTE: New Reference Range   03/23/14  Routine UA:    23-Apr-16 17:01, Urinalysis  Color (UA) Yellow  Clarity (UA) Clear  Glucose (UA) Negative  Bilirubin (UA) Negative  Ketones (UA) Negative  Specific Gravity (UA) 1.014  Blood (UA) Negative  pH (UA) 5.0  Protein (UA) Negative  Nitrite (UA) Negative  Leukocyte Esterase (UA) Negative  Result(s) reported on 08 May 2014 at 05:22PM.  RBC (UA) 0-5  WBC (UA) 0-5  Bacteria (UA)   NONE SEEN  Epithelial  Cells (UA)   NONE SEEN  Mucous (UA) PRESENT  Hyaline Cast (UA) PRESENT  Result(s) reported on 08 May 2014 at 05:22PM.  Routine Coag:    23-Apr-16 16:29, Activated PTT  Activated PTT (APTT) 28.0  A HCT value >55% may artifactually increase the APTT. In one study,  the increase was an average of 19%.  Reference: "Effect on Routine and Special Coagulation Testing Values  of Citrate Anticoagulant Adjustment in Patients with High HCT Values."  American Journal of Clinical Pathology 2006;126:400-405.    23-Apr-16 16:29, Prothrombin Time  Prothrombin 11.8  11.4-15.0  NOTE: New Reference Range   02/12/14  INR 0.9  INR reference interval applies to patients on anticoagulant therapy.  A single INR therapeutic range for coumarins is not optimal for all  indications; however, the suggested range for most indications is  2.0 - 3.0.  Exceptions to the INR Reference  Range may include: Prosthetic heart  valves, acute myocardial infarction, prevention of myocardial  infarction, and combinations of aspirin and anticoagulant. The need  for a higher or lower target INR must be assessed individually.  Reference: The Pharmacology and Management of the Vitamin K   antagonists: the seventh ACCP Conference on Antithrombotic and  Thrombolytic Therapy. [id number].[id number] Sept:126 (3suppl): [id number].  A HCT value >55% may artifactually increase the PT.  In one study,   the increase was an average of 25%.  Reference:  "Effect on Routine and Special Coagulation Testing Values  of Citrate Anticoagulant Adjustment in Patients with High HCT Values."  American Journal of Clinical Pathology 2006;126:400-405.  Routine Hem:    23-Apr-16 16:29, CBC Profile  WBC (CBC) 12.7  RBC (CBC) 4.60  Hemoglobin (CBC) 13.0  Hematocrit (CBC) 41.7  Platelet Count (CBC) 177  MCV 91  MCH 28.2  MCHC 31.1  RDW 15.8  Neutrophil % 89.1  Lymphocyte % 5.2  Monocyte % 5.4  Eosinophil % 0.1  Basophil % 0.2  Neutrophil # 11.3   Lymphocyte # 0.7  Monocyte # 0.7  Eosinophil # 0.0  Basophil # 0.0  Result(s) reported on 08 May 2014 at 04:46PM.    24-Apr-16 08:27, CBC Profile  WBC (CBC) 12.6  RBC (CBC) 4.04  Hemoglobin (CBC) 11.4  Hematocrit (CBC) 36.9  Platelet Count (CBC) 139  MCV 91  MCH 28.2  MCHC 30.9  RDW 15.6  Neutrophil % 90.2  Lymphocyte % 3.3  Monocyte % 6.2  Eosinophil % 0.2  Basophil % 0.1  Neutrophil # 11.4  Lymphocyte # 0.4  Monocyte # 0.8  Eosinophil # 0.0  Basophil # 0.0  Result(s) reported on 09 May 2014 at 08:44AM.    25-Apr-16 04:55, CBC Profile  WBC (CBC) 7.8  RBC (CBC) 3.65  Hemoglobin (CBC) 10.3  Hematocrit (CBC) 33.4  Platelet Count (CBC) 126  MCV 92  MCH 28.1  MCHC 30.7  RDW 15.5  Neutrophil % 83.6  Lymphocyte % 7.8  Monocyte % 7.4  Eosinophil % 1.0  Basophil % 0.2  Neutrophil # 6.5  Lymphocyte # 0.6  Monocyte # 0.6  Eosinophil # 0.1  Basophil # 0.0  Result(s) reported on 10 May 2014 at 05:42AM.    26-Apr-16 06:38, CBC Profile  WBC (CBC) 14.9  RBC (CBC) 3.86  Hemoglobin (CBC) 10.9  Hematocrit (CBC) 36.6  Platelet Count (CBC) 106  MCV 95  MCH 28.4  MCHC 29.9  RDW 15.7  Bands 6  Segmented Neutrophils 81  Lymphocytes 4  Monocytes 6  Metamyelocyte 2  Myelocyte 1  NRBC 1  Diff Comment 1   ANISOCYTOSIS  Diff Comment 2   POLYCHROMASIA  Diff Comment 3   NORMAL PLT MORPHOLGY   Result(s) reported on 11 May 2014 at 06:57AM.    27-Apr-16 03:50, CBC Profile  WBC (CBC) 11.4  RBC (CBC) 4.07  Hemoglobin (CBC) 11.5  Hematocrit (CBC) 36.7  Platelet Count (CBC) 71  MCV 90  MCH 28.1  MCHC 31.2  RDW 16.0  Segmented Neutrophils 89  Lymphocytes 8  Monocytes 3  NRBC 2  Diff Comment 1   ANISOCYTOSIS  Diff Comment 2   BURR CELLS  Diff Comment 3   PLTS VARIED IN SIZE  Diff Comment 4   LARGE PLATELETS   Result(s) reported on 12 May 2014 at 05:51AM.    28-Apr-16 04:17, CBC Profile  WBC (CBC) 13.6  RBC (CBC) 3.84  Hemoglobin (CBC) 10.9  Hematocrit  (CBC) 34.4  Platelet Count (CBC) 72  MCV 90  MCH 28.4  MCHC 31.7  RDW 16.6  Neutrophil % 89.7  Lymphocyte % 4.3  Monocyte % 5.7  Eosinophil % 0.0  Basophil % 0.3  Neutrophil # 12.2  Lymphocyte # 0.6  Monocyte # 0.8  Eosinophil # 0.0  Basophil # 0.0   RADIOLOGY:  Radiology Results: XRay:    23-Apr-16 16:46, Chest Portable Single View  Chest Portable Single View  REASON FOR EXAM:    Sepsis  COMMENTS:       PROCEDURE: DXR - DXR PORTABLE CHEST SINGLE VIEW  - May 08 2014  4:46PM     CLINICAL DATA:  Sepsis.  Lethargy for 2 days.  Patient unresponsive.    EXAM:  PORTABLE CHEST - 1 VIEW    COMPARISON:  None.    FINDINGS:  Patient is rotated to the RIGHT. There is patchy density over the  RIGHT hemidiaphragm, suspicious for aspiration or pneumonia.  Asymmetric pulmonary edema considered less likely. Based on  rotation, this could be an atypical appearance of atelectasis is  well.    The cardiopericardial silhouette appears within normal limits  allowing for rotation. Monitoring leads project over the chest.  Calcifications in the upper abdomen are likely atherosclerotic.     IMPRESSION:  RIGHT lower lobe airspace opacity suspicious for pneumonia. Followup  in 4 weeks to ensure radiographic clearing and exclude an underlying  lesion is recommended.      Electronically Signed    By: Dereck Ligas M.D.    On: 05/08/2014 17:22         Verified By: Rodney Booze. LAMKE, M.D.,    26-Apr-16 14:07, Abdomen AP Only  Abdomen AP Only  REASON FOR EXAM:    OG placement  COMMENTS:       PROCEDURE: DXR - DXR ABDOMEN AP ONLY  - May 11 2014  2:07PM     CLINICAL DATA:  Evaluate OG tube placement    EXAM:  ABDOMEN - 1 VIEW    COMPARISON:  05/11/2014    FINDINGS:  OG tube is not demonstrated below the hemidiaphragms on the portable  AP supine exam. Relative paucity of bowel gas. Small amount of air  in within the lower abdomen and pelvis. No definite obstruction  pattern.  Severe degenerative changes of the thoracolumbar spine with  an associated severe scoliosis. Bones are osteopenic. Fixation  hardware present of the right hip. Healed right rami pelvic  fractures. Aortoiliac atherosclerosis noted. Bones are osteopenic.    Complete opacification of the visualize right inferior chest, new  since 05/08/2014. Suspect interval development of a large right  effusion and right lung collapse/ consolidation.     IMPRESSION:  No visualized OG tube on the portable supine exam at 1400 hr.    Right lower chest complete opacification, suspect large right  effusion and right lung collapse/consolidation  No definite obstruction pattern    Other chronic findings as above      Electronically Signed    By: Jerilynn Mages.  Shick M.D.    On: 05/11/2014 15:50         Verified By: Earl Gala, M.D.,    26-Apr-16 14:07, Chest Portable Single View  Chest Portable Single View  REASON FOR EXAM:    central line placement; intubation  COMMENTS:       PROCEDURE: DXR - DXR PORTABLE CHEST SINGLE VIEW  - May 11 2014  2:07PM     CLINICAL DATA:  Hypoxia  EXAM:  PORTABLE CHEST - 1 VIEW    COMPARISON:  May 08, 2014    FINDINGS:  Endotracheal tube tip is 2.7 cm above the carina. Central catheter  tip is in the superior vena cava. Nasogastric tube tip and side port  are below the diaphragm. No pneumothorax. There is complete  opacification of the right hemithorax, likely due toa combination  of effusion and consolidation. The left lung is mildly hyperexpanded  but clear. The heart is normal in size and contour. Pulmonary  vascularity on the left is normal.     IMPRESSION:  Tube and catheter positions as described without pneumothorax.  Complete opacification of the right hemithorax, apparently due to a  combination of diffuse consolidation and effusion. The appearance on  the right represents a dramatic change compared to recent prior  study.      Electronically  Signed    By: Lowella Grip III M.D.    On: 05/11/2014 15:32         Verified By: Leafy Kindle. WOODRUFF, M.D.,    26-Apr-16 18:47, Abdomen AP Only  Abdomen AP Only  REASON FOR EXAM:    OG placement  COMMENTS:   Bedside (portable):Y    PROCEDURE: DXR - DXR ABDOMEN AP ONLY  - May 11 2014  6:47PM     CLINICAL DATA:  Status post orogastric tube placement    EXAM:  ABDOMEN - 1 VIEW    COMPARISON:  Prior chest x-ray earlier today at 13:53 p.m.    FINDINGS:  A gastric tube is been placed. The tip of the tube is coiled within  the gastric antrum. Evaluation is difficult secondary to artifact  from multiple overlapping cardiac leads, however the tube can be  confidently identified. Re- demonstration of severe dextro convex  scoliosis of the lumbar spine centered at L2-L3. Large right  layering pleural effusion with associated atelectasis. Small left  effusion. Atherosclerotic vascular calcifications again noted. The  bowel gas pattern is not obstructed.     IMPRESSION:  The tip of the orogastric tube is coiled within the gastric antrum.      Electronically Signed    By: Jacqulynn Cadet M.D.    On: 05/11/2014 19:25     Verified By: Criselda Peaches, M.D.,    27-Apr-16 09:44, Chest Portable Single View  Chest Portable Single View  REASON FOR EXAM:    post bronch  COMMENTS:       PROCEDURE: DXR - DXR PORTABLE CHEST SINGLE VIEW  - May 12 2014  9:44AM     CLINICAL DATA:  Status post bronchoscopy. Sepsis. Pneumonia. History  of colon cancer.    EXAM:  PORTABLE CHEST - 1 VIEW    COMPARISON:  05/11/2014    FINDINGS:  Endotracheal tube 2.6 cm above the carina. Nasogastric tube enters  the stomach. Left IJ line tip: SVC.    Interval Re aeration of the right upper lobe. Continued atelectasis/  opacification of the right lower lobe and right middle lobe.  Continued indistinct airspace opacity at the left lung base. No  pneumothorax.    Old healed left lateral rib  fractures. Tortuous thoracic aorta.  Indistinct pulmonary vasculature in the Re aerated right upper lobe.     IMPRESSION:  1. Interval aeration of the right upper lobe, with indistinct  pulmonary vasculature in this vicinity. Continued collapse of the  right middle lobe and right lower lobe.  2. Indistinct airspace opacity at the left lung base could be  from  edema or pneumonia.  3. Tortuous thoracic aorta.  4. Tubes and lines appear satisfactorily positioned.      Electronically Signed    By: Van Clines M.D.    On: 05/12/2014 10:14         Verified By: Carron Curie, M.D.,  [place]:    23-Apr-16 19:48, US Kidney Bilateral  US Kidney Bilateral  REASON FOR EXAM:    aki  COMMENTS:       PROCEDURE: [place]  - US KIDNEY  - May 08 2014  7:48PM     CLINICAL DATA:  Acute kidney injury.    EXAM:  RENAL/URINARY TRACT ULTRASOUND COMPLETE    COMPARISON:  None.    FINDINGS:  Right Kidney:  Length: 9.4 cm.. Echogenicity within normal limits. No mass or  hydronephrosis visualized. Trace perinephric fluid of uncertain  significance.    Left Kidney:    Length: 10.1 cm. Echogenicity within normal limits. No mass or  hydronephrosis visualized.    Bladder:    Appears normal for degree of bladder distention.     IMPRESSION:  No hydronephrosis. Trace RIGHT perinephric fluid of uncertain  significance.      Electronically Signed    By: Rolla Flatten M.D.    On: 05/08/2014 20:06         Verified By: Staci Righter, M.D.,    28-Apr-16 15:00, [place] Color Doppler Upper Extrem Bilat (Arms)  [place] Color Doppler Upper Extrem Bilat (Arms)  REASON FOR EXAM:    Swelling  COMMENTS:       PROCEDURE: [place]  - US DOPPLER UP EXTR  BILATERAL  - May 13 2014  3:00PM     CLINICAL DATA:  Bilateral upper extremity edema, history of colon  cancer, prior left central line    EXAM:  BILATERAL UPPER EXTREMITY VENOUS DOPPLER ULTRASOUND    TECHNIQUE:  Gray-scale sonography with graded  compression, as well as color  Doppler and duplex ultrasound were performed to evaluate the  bilateral upper extremity deep venous systems from the level of the  subclavian vein and including the jugular, axillary, basilic,  radial, ulnar and upper cephalic vein. Spectral Doppler was utilized  to evaluate flow at rest and with distal augmentation maneuvers.    COMPARISON:  None.    FINDINGS:  RIGHT UPPER EXTREMITY    Internal Jugular Vein: No evidence of thrombus. Normal  compressibility, respiratory phasicity and response to augmentation.    Subclavian Vein: No evidence of thrombus. Normal compressibility,  respiratory phasicity and response to augmentation.  Axillary Vein: No evidence of thrombus. Normal compressibility,  respiratory phasicity and response to augmentation.    Cephalic Vein: No evidence of thrombus. Normal compressibility,  respiratory phasicity and response to augmentation.    Basilic Vein: No evidence of thrombus. Normal compressibility,  respiratory phasicity and response to augmentation.    Brachial Veins: No evidence of thrombus. Normal compressibility,  respiratory phasicity and response to augmentation.    Radial Veins: No evidence of thrombus. Normal compressibility,  respiratory phasicity and response to augmentation.  Ulnar Veins: No evidence of thrombus. Normal compressibility,  respiratory phasicity and response to augmentation.    Venous Reflux:  None.    Other Findings:  Diffuse subcutaneous edema noted    LEFT UPPER EXTREMITY    Internal Jugular Vein: No evidence of thrombus. Normal  compressibility, respiratory phasicity and response to augmentation.    Subclavian Vein: No evidence of thrombus. Normal compressibility,  respiratory phasicity and response  to augmentation.  Axillary Vein: No evidence of thrombus. Normal compressibility,  respiratory phasicity and response to augmentation.    Cephalic Vein: No evidence of thrombus. Normal  compressibility,  respiratory phasicity and response to augmentation.    Basilic Vein: No evidence of thrombus. Normal compressibility,  respiratory phasicity and response to augmentation.    Brachial Veins: No evidence of thrombus. Normal compressibility,  respiratory phasicity and response to augmentation.    Radial Veins: No evidence of thrombus. Normal compressibility,  respiratory phasicity and response to augmentation.  Ulnar Veins: No evidence of thrombus. Normal compressibility,  respiratory phasicity and response to augmentation.    Venous Reflux:  None.    Other Findings:  Diffuse subcutaneous edema noted.     IMPRESSION:  No evidence of deep venous thrombosis.      Electronically Signed    By: Jerilynn Mages.  Shick M.D.    On: 05/13/2014 16:23     Verified By: Earl Gala, M.D.,  LabUnknown:    23-Apr-16 16:46, Chest Portable Single View  PACS Image    23-Apr-16 17:46, CT Head Without Contrast  PACS Image    23-Apr-16 19:48, US Kidney Bilateral  PACS Image    26-Apr-16 14:07, Abdomen AP Only  PACS Image    26-Apr-16 14:07, Chest Portable Single View  PACS Image    26-Apr-16 18:47, Abdomen AP Only  PACS Image    27-Apr-16 09:44, Chest Portable Single View  PACS Image    28-Apr-16 15:00, [place] Color Doppler Upper Extrem Bilat (Arms)  PACS Image  CT:    23-Apr-16 17:46, CT Head Without Contrast  CT Head Without Contrast  REASON FOR EXAM:    confusion, lethargy x 1 day  COMMENTS:       PROCEDURE: CT  - CT HEAD WITHOUT CONTRAST  - May 08 2014  5:46PM     CLINICAL DATA:  Lethargy    EXAM:  CT HEAD WITHOUT CONTRAST    TECHNIQUE:  Contiguous axial images were obtained from the base of the skull  through the vertex without intravenous contrast.    COMPARISON:  None.  FINDINGS:  Right temporal craniotomy for aneurysm clipping. Aneurysm clip in  the region of the right posterior communicating artery.    Generalized atrophy. Mild chronic microvascular ischemic  change in  the white matter.    Negative for acute infarct. Negative for hemorrhage or mass. No  acute bony abnormality.     IMPRESSION:  History of aneurysm clipping.  No acute abnormality.      Electronically Signed    By: Franchot Gallo M.D.    On: 05/08/2014 18:31         Verified By: Truett Perna, M.D.,   ASSESSMENT AND PLAN:  Assessment/Admission Diagnosis 1. ARF, now needs dialysis and we are asked to place catheter for immediate use 2. MSOF 3. respiratory failure on the vent 4. tobacco dependence   Plan temporary dialysis catheter placed at bedside without difficulty and OK to use.  This was placed in right groin to save right jugular for Permcath if she survives and her renal function does not return.  Left IJ line in place.      level 3 consult   Electronic Signatures: Algernon Huxley (MD)  (Signed 28-Apr-16 17:12)  Authored: Chief Complaint and History, PAST MEDICAL/SURGICAL HISTORY, ALLERGIES, HOME MEDICATIONS, Family and Social History, Review of Systems, Physical Exam, LABS, RADIOLOGY, Assessment and Plan   Last Updated: 28-Apr-16 17:12 by Algernon Huxley (MD)

## 2014-05-16 NOTE — Progress Notes (Signed)
Subjective: Pt remains critically ill. Still on the vent.  Had HD yesterday.  Still oliguric at the moment.  Objective: Vital signs in last 24 hours: Temp:  [98.3 F (36.8 C)-99.9 F (37.7 C)] 98.3 F (36.8 C) (05/01 1145) Pulse Rate:  [93-110] 110 (05/01 1100) Resp:  [18-26] 18 (05/01 1100) BP: (104-137)/(51-67) 134/63 mmHg (05/01 1145) SpO2:  [96 %-98 %] 98 % (05/01 1207) FiO2 (%):  [35 %] 35 % (05/01 1203) Weight change:   Intake/Output from previous day: 04/30 0701 - 05/01 0700 In: 570 [I.V.:165; NG/GT:405] Out: -  Intake/Output this shift: Total I/O In: 419 [P.O.:100; I.V.:60; Other:14; NG/GT:245] Out: 35 [Urine:35]  General appearance: critically ill appearing HEENT: Atraumatic; EOMI, ETT in place Neck: Trachea midline; supple Lungs: bilateral rhonchi, vent assisted CV: S1S2, no rubs Abdomen: Soft, non-tender; BS present Extremities: trace peripheral edema Skin: Warm and dry, no rashes noted. Neuro/Psych: currently awake, will follow simple commands. GU: foley in place   Lab Results:  Recent Labs  05/14/14 0411 05/15/14 0428  WBC 13.9* 13.9*  HGB 9.0* 8.7*  HCT 28.6* 26.8*  PLT 53* 50*   BMET:  Recent Labs  05/15/14 0428 05/16/14 0730  NA  --  139  K  --  3.9  CL  --  105  CO2 26 29  GLUCOSE  --  125*  BUN 54* 39*  CREATININE 3.18* 2.52*  CALCIUM 8.3* 8.1*   No results for input(s): PTH in the last 72 hours. Iron Studies: No results for input(s): IRON, TIBC, TRANSFERRIN, FERRITIN in the last 72 hours.  Studies/Results: No results found.  I have reviewed the patient's current medications.  Assessment/Plan: 74 y.o. female history of colon cancer status post colectomy,  cerebral aneurysm status post clipping, hypertension, hyperlipidemia, chronic deconditioning   Baseline Creatinine 06/19/2013  1.02* / GFR > 60 ([facility] records)  1. ARF, due to ATN. 2. Bacterial Pneumonia 3. Hypotension 4. Acute respiratory failure.  Plan:   Pt  remains oliguric at this point in time, therefore will likely plan for HD again tomorrow.  No acute indication to perform HD today.  Continue vent support as well as antibiotics including zosyn for now.  Maintain MAP of 65 or greater.       LOS: 8 days   Corlette Ciano 05/16/2014,12:27 PM

## 2014-05-16 NOTE — Progress Notes (Signed)
Patient ID: Caitlyn Wang, female   DOB: 01-Jan-1941, 74 y.o.   MRN: [id number] [facility] PROGRESS NOTE  ZARAYAH LANTING ZJI:[id number] DOB: 01-Dec-1940 DOA: 05/08/2014 PCP: No PCP Per Patient  HPI/Subjective: Patient is intubated. She is able to follow some simple commands. She is able to squeeze my hands to command. She shakes her head no to questions.   Objective: Filed Vitals:   05/16/14 1300  BP: 104/48  Pulse: 89  Temp:   Resp: 18    Intake/Output Summary (Last 24 hours) at 05/16/14 1401 Last data filed at 05/16/14 1300  Gross per 24 hour  Intake   1024 ml  Output     35 ml  Net    989 ml   There were no vitals filed for this visit.  ROS: Review of Systems  Cardiovascular: Negative for chest pain.  Gastrointestinal: Negative for abdominal pain and diarrhea.  Musculoskeletal: Negative for joint pain.   review of systems is limited secondary to the patient being on the ventilator. Exam: Physical Exam  HENT:  Nose: No mucosal edema.  Eyes: Conjunctivae and lids are normal. Pupils are equal, round, and reactive to light.  Neck: No JVD present. Carotid bruit is not present. No edema present. No thyroid mass and no thyromegaly present.  Cardiovascular: S1 normal and S2 normal.  Exam reveals no gallop.   No murmur heard. Pulses:      Dorsalis pedis pulses are 2+ on the right side, and 2+ on the left side.  Respiratory: No respiratory distress. She has no wheezes. She has no rhonchi. She has no rales.  GI: Soft. Bowel sounds are normal. There is no tenderness.  Lymphadenopathy:    She has no cervical adenopathy.  Neurological: She is alert.  Skin: Skin is warm. No rash noted. Nails show no clubbing.    Data Reviewed: Basic Metabolic Panel:  Recent Labs Lab 05/12/14 0350 05/13/14 0417 05/14/14 0411 05/15/14 0428 05/16/14 0400 05/16/14 0730  NA  --   --   --   --   --  139  K  --   --   --   --   --  3.9  CL  --   --   --   --   --  105  CO2  19* 20* 23 26  --  29  GLUCOSE  --   --   --   --   --  125*  BUN 46* 59* 60* 54*  --  39*  CREATININE 2.66* 3.50* 3.51* 3.18*  --  2.52*  CALCIUM 8.7* 9.1 8.5* 8.3*  --  8.1*  MG  --   --   --   --  1.6*  --   PHOS  --   --   --   --  1.6*  --    CBC:  Recent Labs Lab 05/10/14 0455 05/11/14 0638 05/12/14 0350 05/13/14 0417 05/14/14 0411 05/15/14 0428  WBC 7.8 14.9* 11.4* 13.6* 13.9* 13.9*  NEUTROABS 6.5  --   --  12.2* 11.7* 11.2*  HGB 10.3* 10.9* 11.5* 10.9* 9.0* 8.7*  HCT 33.4* 36.6 36.7 34.4* 28.6* 26.8*  MCV 92 95 90 90 88 87  PLT 126* 106* 71* 72* 53* 50*      Recent Results (from the past 240 hour(s))  Culture, blood (single)     Status: None   Collection Time: 05/08/14  4:29 PM  Result Value Ref Range Status   Micro Text  Report   Final       COMMENT                   NO GROWTH AEROBICALLY IN 5 DAYS   ANTIBIOTIC                                                      Culture, blood (single)     Status: None   Collection Time: 05/08/14  4:37 PM  Result Value Ref Range Status   Micro Text Report   Final       COMMENT                   NO GROWTH AEROBICALLY/ANAEROBICALLY IN 5 DAYS   ANTIBIOTIC                                                      Urine culture     Status: None   Collection Time: 05/08/14  5:01 PM  Result Value Ref Range Status   Micro Text Report   Final       SOURCE: IN/OUT CATH    COMMENT                   NO GROWTH IN 36 HOURS   ANTIBIOTIC                                                      Bronchial Wash Culture     Status: None   Collection Time: 05/12/14  8:55 AM  Result Value Ref Range Status   Micro Text Report   Final       ORGANISM 1                HEAVY GROWTH CANDIDA ALBICANS   COMMENT                   -   GRAM STAIN                FAIR SPECIMEN-70-80% WBC   GRAM STAIN                MODERATE WHITE BLOOD CELLS   GRAM STAIN                NO ORGANISMS SEEN   ANTIBIOTIC                    ORG#1                                                Culture, expectorated sputum-assessment     Status: None (Preliminary result)   Collection Time: 05/14/14 12:00 PM  Result Value Ref Range Status   Micro Text Report   Preliminary       SOURCE: ETS    COMMENT  HOLDING FOR POSSIBLE PATHOGEN   GRAM STAIN                FEW WHITE BLOOD CELLS   GRAM STAIN                RARE GRAM POSITIVE COCCI   GRAM STAIN                POOR SPECIMEN-LESS THAN 70% WBC   ANTIBIOTIC                                                         Studies: No results found.  Scheduled Meds: . antiseptic oral rinse  7 mL Mouth Rinse QID  . aspirin  81 mg Oral Daily  . budesonide (PULMICORT) nebulizer solution  0.5 mg Nebulization Q12H  . chlorhexidine  15 mL Mouth Rinse BID  . diltiazem  30 mg Oral 4 times per day  . docusate  100 mg Oral Daily  . [START ON 05/17/2014] famotidine  20 mg Oral Q48H  . gabapentin  600 mg Oral 3 times per day  . insulin aspart  0-10 Units Subcutaneous Q6H  . ipratropium-albuterol  3 mL Nebulization Q4H  . piperacillin-tazobactam (ZOSYN)  IV  3.375 g Intravenous Q12H  . sodium chloride  10 mL Intravenous Q12H   Continuous Infusions: . feeding supplement (VITAL HIGH PROTEIN) 1,000 mL (05/16/14 1300)  . fentaNYL infusion INTRAVENOUS 180 mcg/hr (05/16/14 1200)    Assessment/Plan: #1 acute respiratory failure with hypercapnia. From septic shock and right middle lobe pneumonia. Patient currently on light sedation and following commands. FiO2 requirements down to 40%. Case discussed with Dr. Stevenson Clinch pulmonology. Hopefully can start weaning soon. #2 septic shock and a right middle lobe pneumonia -Patient is currently on Zosyn. #3 acute renal failure secondary to ATN with poor urine output. -Hemodialysis as per nephrology. #4 elevated troponin secondary to demand ischemia. -Patient is on aspirin #5 atrial fibrillation with rapid ventricular response -Patient is on by mouth Cardizem. #6  thrombocytopenia- continue to monitor counts closely. #7 anemia continue to watch counts closely #8 hypomagnesemia we'll replace IV #9 nutrition continue tube feeding as per dietary.  Code Status:     Code Status Orders        Start     Ordered   05/16/14 0001  Full code   Continuous     05/15/14 1520     Disposition Plan: Patient awaiting transfer to [facility].  Time spent: 32 minutes critical care time.   [name]  [facility]

## 2014-05-17 LAB — EXPECTORATED SPUTUM ASSESSMENT W REFEX TO RESP CULTURE

## 2014-05-17 LAB — BLOOD GAS, ARTERIAL
ACID-BASE EXCESS: 6.1 mmol/L — AB (ref 0.0–3.0)
Bicarbonate: 31.2 mEq/L — ABNORMAL HIGH (ref 21.0–28.0)
Drawn by: 347671
FIO2: 35 %
O2 SAT: 95.2 %
PH ART: 7.44 (ref 7.350–7.450)
Patient temperature: 37
pCO2 arterial: 46 mmHg (ref 32.0–48.0)
pO2, Arterial: 74 mmHg — ABNORMAL LOW (ref 83.0–108.0)

## 2014-05-17 LAB — GLUCOSE, CAPILLARY
GLUCOSE-CAPILLARY: 109 mg/dL — AB (ref 70–99)
GLUCOSE-CAPILLARY: 111 mg/dL — AB (ref 70–99)
GLUCOSE-CAPILLARY: 115 mg/dL — AB (ref 70–99)
Glucose-Capillary: 97 mg/dL (ref 70–99)

## 2014-05-17 LAB — BASIC METABOLIC PANEL
Anion gap: 6 (ref 5–15)
BUN: 50 mg/dL — AB (ref 6–20)
CALCIUM: 8.5 mg/dL — AB (ref 8.9–10.3)
CO2: 29 mmol/L (ref 22–32)
CREATININE: 3.23 mg/dL — AB (ref 0.44–1.00)
Chloride: 103 mmol/L (ref 101–111)
GFR, EST AFRICAN AMERICAN: 15 mL/min — AB (ref >60)
GFR, EST NON AFRICAN AMERICAN: 13 mL/min — AB (ref >60)
Glucose, Bld: 109 mg/dL — ABNORMAL HIGH (ref 65–99)
Potassium: 4.1 mmol/L (ref 3.5–5.1)
Sodium: 138 mmol/L (ref 135–145)

## 2014-05-17 LAB — MAGNESIUM: MAGNESIUM: 2.2 mg/dL (ref 1.7–2.4)

## 2014-05-17 LAB — HEMOGLOBIN: HEMOGLOBIN: 8.4 g/dL — AB (ref 12.0–16.0)

## 2014-05-17 LAB — PHOSPHORUS: Phosphorus: 2.9 mg/dL (ref 2.5–4.6)

## 2014-05-17 MED ORDER — OXYCODONE HCL 5 MG PO TABS
ORAL_TABLET | ORAL | Status: AC
  Filled 2014-05-17: qty 1

## 2014-05-17 MED ORDER — OXYCODONE HCL 5 MG PO TABS
5.0000 mg | ORAL_TABLET | ORAL | Status: DC | PRN
  Administered 2014-05-17 (×2): 5 mg via ORAL
  Administered 2014-05-17: 17:00:00 via ORAL
  Filled 2014-05-17 (×2): qty 1

## 2014-05-17 NOTE — Progress Notes (Signed)
Date of Admission:  05/08/2014    ID: DEBBRAH SAMPEDRO is a 74 y.o. female with  Multiple medical problems   Active Problems:   Sepsis  Subjective: Transferred to CCU  Medications:   aspirin  81 mg Oral Daily   budesonide (PULMICORT) nebulizer solution  0.5 mg Nebulization Q12H   diltiazem  30 mg Oral 4 times per day   docusate  100 mg Oral Daily   famotidine  20 mg Oral Q48H   gabapentin  600 mg Oral 3 times per day   insulin aspart  0-10 Units Subcutaneous Q6H   ipratropium-albuterol  3 mL Nebulization Q4H   oxyCODONE       piperacillin-tazobactam (ZOSYN)  IV  3.375 g Intravenous Q12H   sodium chloride  10 mL Intravenous Q12H   Objective: Vital signs in last 24 hours: Temp:  [97.5 F (36.4 C)-99 F (37.2 C)] 97.6 F (36.4 C) (05/02 1200) Pulse Rate:  [69-102] 100 (05/02 1429) Resp:  [8-19] 16 (05/02 1429) BP: (90-143)/(54-81) 90/61 mmHg (05/02 1429) SpO2:  [91 %-100 %] 92 % (05/02 1429) FiO2 (%):  [35 %-40 %] 40 % (05/02 1200) Weight:  [61.6 kg (135 lb 12.9 oz)] 61.6 kg (135 lb 12.9 oz) (05/02 0426)  Physical Exam  Constitutional: frail, chronically ill appearing HENT: [place]/AT, PERRLA, no scleral icterus Mouth/Throat: Oropharynx is clear and moist. No oropharyngeal exudate.  Cardiovascular: Normal rate, regular rhythm and normal heart sounds. Exam reveals no gallop and no friction rub.  No murmur heard.  Pulmonary/Chest:rhonchi Abdominal: Soft. Bowel sounds are normal.  exhibits no distension. There is no tenderness.  Lymphadenopathy: no cervical adenopathy. No axillary adenopathy Neurological: alert and oriented to person, place, and time.  Skin: Skin is warm and dry. No rash noted. No erythema.  Psychiatric: a normal mood and affect.  behavior is normal.   Lab Results  Recent Labs  05/15/14 0428  05/16/14 0730 05/17/14 0355  WBC 13.9*  --   --   --   HGB 8.7*  --   --  8.4*  HCT 26.8*  --   --   --   NA 139  --  139 138  K 3.3*  < > 3.9 4.1   CL 107  --  105 103  CO2 26  --  29 29  BUN 54*  --  39* 50*  CREATININE 3.18*  --  2.52* 3.23*  < > = values in this interval not displayed.   Microbiology: Recent Results (from the past 240 hour(s))  Culture, blood (single)     Status: None   Collection Time: 05/08/14  4:29 PM  Result Value Ref Range Status   Micro Text Report   Final       COMMENT                   NO GROWTH AEROBICALLY IN 5 DAYS   ANTIBIOTIC                                                      Culture, blood (single)     Status: None   Collection Time: 05/08/14  4:37 PM  Result Value Ref Range Status   Micro Text Report   Final       COMMENT  NO GROWTH AEROBICALLY/ANAEROBICALLY IN 5 DAYS   ANTIBIOTIC                                                      Urine culture     Status: None   Collection Time: 05/08/14  5:01 PM  Result Value Ref Range Status   Micro Text Report   Final       SOURCE: IN/OUT CATH    COMMENT                   NO GROWTH IN 36 HOURS   ANTIBIOTIC                                                      Bronchial Wash Culture     Status: None   Collection Time: 05/12/14  8:55 AM  Result Value Ref Range Status   Micro Text Report   Final       ORGANISM 1                HEAVY GROWTH CANDIDA ALBICANS   COMMENT                   -   GRAM STAIN                FAIR SPECIMEN-70-80% WBC   GRAM STAIN                MODERATE WHITE BLOOD CELLS   GRAM STAIN                NO ORGANISMS SEEN   ANTIBIOTIC                    ORG#1                                               Culture, expectorated sputum-assessment     Status: None   Collection Time: 05/14/14 12:00 PM  Result Value Ref Range Status   Micro Text Report   Final       SOURCE: ETS    ORGANISM 1                LIGHT GROWTH CANDIDA ALBICANS   COMMENT                   -   GRAM STAIN                FEW WHITE BLOOD CELLS   GRAM STAIN                RARE GRAM POSITIVE COCCI   GRAM STAIN                POOR  SPECIMEN-LESS THAN 70% WBC   ANTIBIOTIC                    ORG#1  Studies/Results: No results found.  Assessment/Plan: 74 year old, very heavy tobacco use with over 100 pack years, admitted with pneumonia April 23 when she was found lethargic. She also had acute renal failure and is on CRRT. She was covered with broad spectrum antibiotics and on April 27 underwent bronchoscopy.  Bacterial cultures are negative from that but fungal cultures are growing yeast.  She was treated with vancomycin, Zosyn, and levofloxacin.  The bronchoscopy did show a lot of purulence. I think she is at high risk for lung cancer but it does not sound like any intrabronchial lesions were noted on bronchoscopy. She has not had a CT of her chest.  We discontined vanco on 4/28. Stopped levo 4/29  Day 9  zosyn Clinically extubated, much improved. RECOMMENDATIONS:   She may have a yeast tracheitis, but Candida does not usually cause an aggressive pneumonia or invasive pulmonary disease. Is s/p fluconazole I would continue the Zosyn  - rec total 10 day course =- stop date 5/3   Leonel Ramsay   05/17/2014, 3:28 PM

## 2014-05-17 NOTE — Progress Notes (Signed)
Patient tolerated treatment without complication. Blood returned per policy; catheter lumens packed with heparin. 2054ml fluid removal. Catheter dressing changed no sign of infection noted.

## 2014-05-17 NOTE — Evaluation (Signed)
Speech Language Pathology Evaluation Patient Details Name: Caitlyn Wang MRN: [id number] DOB: 01-17-1940 Today's Date: 05/17/2014 Time:  -     Problem List:  Patient Active Problem List   Diagnosis Date Noted  . Sepsis 05/15/2014   Past Medical History: No past medical history on file. Past Surgical History: No past surgical history on file. HPI:      Assessment / Plan / Recommendation Clinical Impression  Pt appears to present w/ reduced risk for aspiration at this time. Pt appears to safely tolerate po's of thin liquids and purees; unable to assess solids sec. to dialysis session. Pt requires support during feeding; aspiration precautions.     SLP Assessment       Follow Up Recommendations       Frequency and Duration        Pertinent Vitals/Pain Pain Assessment: No/denies pain   SLP Goals  Patient/Family Stated Goal: none given  SLP Evaluation Prior Functioning      Cognition  Orientation Level: Disoriented to situation    Comprehension       Expression     Oral / Motor Oral Motor/Sensory Function Overall Oral Motor/Sensory Function: Appears within functional limits for tasks assessed   GO     Watson,Katherine 05/17/2014, 5:02 PM

## 2014-05-17 NOTE — Plan of Care (Signed)
Problem: SLP Dysphagia Goals Goal: Misc Dysphagia Goal Pt will safely tolerate po diet of least restrictive consistency w/ no overt s/s of aspiration noted by Staff/pt/family x3 sessions.    

## 2014-05-17 NOTE — Progress Notes (Signed)
Patient ID: Caitlyn Wang, female   DOB: 1940-03-06, 74 y.o.   MRN: [id number]  [facility] Physicians PROGRESS NOTE  Caitlyn Wang:[id number] DOB: [birth year] DOA: 05/08/2014 PCP: No PCP Per Patient  HPI/Subjective: Patient was extubated today. Her major complaint was that I woke her up from sleep. She does have some cough and some shortness of breath but otherwise feels okay.  Objective: Filed Vitals:   05/17/14 1235  BP:   Pulse: 100  Temp:   Resp: 17    Intake/Output Summary (Last 24 hours) at 05/17/14 1348 Last data filed at 05/17/14 1100  Gross per 24 hour  Intake 742.98 ml  Output     15 ml  Net 727.98 ml   Filed Weights   05/17/14 0426  Weight: 61.6 kg (135 lb 12.9 oz)    ROSReview of Systems  Constitutional: Negative for fever and chills.  Eyes: Negative for blurred vision.  Respiratory: Positive for cough and shortness of breath.   Cardiovascular: Negative for chest pain.  Gastrointestinal: Negative for nausea, vomiting, diarrhea and constipation.  Genitourinary: Negative for dysuria.  Musculoskeletal: Negative for joint pain.  Neurological: Negative for dizziness and headaches.    Exam: Physical Exam  HENT:  Nose: No mucosal edema.  Eyes: Conjunctivae and lids are normal. Pupils are equal, round, and reactive to light.  Neck: No JVD present. Carotid bruit is not present. No edema present. No thyroid mass and no thyromegaly present.  Cardiovascular: S1 normal and S2 normal.  Exam reveals no gallop.   No murmur heard. Pulses:      Dorsalis pedis pulses are 2+ on the right side, and 2+ on the left side.  Respiratory: No respiratory distress. She has no wheezes. She has rhonchi in the right lower field and the left lower field. She has no rales.  GI: Soft. Bowel sounds are normal. There is no tenderness.  Lymphadenopathy:    She has no cervical adenopathy.  Neurological: She is alert.  Skin: Skin is warm. No rash noted. Nails show no clubbing.   Psychiatric: She has a normal mood and affect. Thought content normal.    Data Reviewed: Basic Metabolic Panel:  Recent Labs Lab 05/13/14 0417 05/14/14 0411 05/15/14 0100 05/15/14 0428 05/15/14 1642 05/16/14 0400 05/16/14 0730 05/17/14 0355  NA 139 139  --  139  --   --  139 138  K 4.2 3.6 3.8 3.3* 3.3*  --  3.9 4.1  CL 114* 109  --  107  --   --  105 103  CO2 20* 23  --  26  --   --  29 29  GLUCOSE 112* 118*  --  121*  --   --  125* 109*  BUN 59* 60*  --  54*  --   --  39* 50*  CREATININE 3.50* 3.51*  --  3.18*  --   --  2.52* 3.23*  CALCIUM 9.1 8.5*  --  8.3*  --   --  8.1* 8.5*  MG  --   --   --   --   --  1.6*  --  2.2  PHOS  --   --   --   --   --  1.6*  --  2.9   CBC:  Recent Labs Lab 05/11/14 0638 05/12/14 0350 05/13/14 0417 05/14/14 0411 05/15/14 0428 05/17/14 0355  WBC 14.9* 11.4* 13.6* 13.9* 13.9*  --   NEUTROABS  --   --  12.2*  11.7* 11.2*  --   HGB 10.9* 11.5* 10.9* 9.0* 8.7* 8.4*  HCT 36.6 36.7 34.4* 28.6* 26.8*  --   MCV 95 90 90 88 87  --   PLT 106* 71* 72* 53* 50*  --      Scheduled Meds: . aspirin  81 mg Oral Daily  . budesonide (PULMICORT) nebulizer solution  0.5 mg Nebulization Q12H  . diltiazem  30 mg Oral 4 times per day  . docusate  100 mg Oral Daily  . famotidine  20 mg Oral Q48H  . gabapentin  600 mg Oral 3 times per day  . insulin aspart  0-10 Units Subcutaneous Q6H  . ipratropium-albuterol  3 mL Nebulization Q4H  . oxyCODONE      . piperacillin-tazobactam (ZOSYN)  IV  3.375 g Intravenous Q12H  . sodium chloride  10 mL Intravenous Q12H       Assessment/Plan: #1 acute respiratory failure with hypercapnia. From septic shock and right middle lobe pneumonia.  -Patient extubated today and is now on 4 L nasal cannula breathing comfortably. We will transfer to CCU stepdown. Hopefully we will be able to get out of the ICU tomorrow if still stable. #2 septic shock and a right middle lobe pneumonia -Patient is currently on Zosyn. #3  acute renal failure secondary to ATN with poor urine output. -Hemodialysis today at the bedside. #4 elevated troponin secondary to demand ischemia. -Patient is on aspirin #5 atrial fibrillation with rapid ventricular response -Patient is on oral Cardizem. #6 thrombocytopenia- continue to monitor counts closely. #7 anemia continue to watch counts closely #8 hypomagnesemia-replaced #9 nutrition -speech pathology evaluation to check swallowing. Diet will be based on that evaluation. #10 weakness-will get physical therapy evaluation. Code Status:     Code Status Orders        Start     Ordered   05/16/14 0001  Full code   Continuous     05/15/14 1520     Disposition Plan: Patient awaiting transfer to [facility].  Time spent: 25 minutes.   Caitlyn Wang  [facility] [place] [facility]

## 2014-05-17 NOTE — Progress Notes (Signed)
   05/17/14 0920  Vent Select  Invasive or Noninvasive Invasive  Adult Vent Y  Adult Ventilator Settings  Vent Mode PSV  FiO2 (%) 35 %  Pressure Support 5 cmH20  PEEP 5 cmH20  Adult Ventilator Measurements  Resp Rate Spontaneous 13 br/min  Resp Rate Total 13 br/min  Exhaled Vt 501 mL  Measured Ve 5.9 mL  HOB> 30 Degrees Y  Adult Ventilator Alarms  Alarms On Y  Ve High Alarm 20 L/min  Ve Low Alarm 4 L/min  Resp Rate High Alarm 50 br/min  Resp Rate Low Alarm 5  PEEP Low Alarm 2 cmH2O  Press High Alarm 45 cmH2O  T Apnea 20 sec(s)  Suction Method  Suctioning Airway  Airway Suctioning/Secretions  Suction Type ETT  Suction Device  Inline  Secretion Amount Moderate  Secretion Color Pink tinged  Suction Tolerance Tolerated well  Suctioning Adverse Effects None  Placed patient on SBT 8/5 35% per Dr. Mortimer Fries.  Patient is tolerating well.

## 2014-05-17 NOTE — Progress Notes (Signed)
   05/17/14 1101  Daily Weaning Assessment  Patient response Passed (Tolerated well)  Extubated patient per MD order.  Patient and order verified.  Placed patient on 40% aerosol face tent.  Patient is tolerating well.

## 2014-05-17 NOTE — Progress Notes (Signed)
PULMONARY / CRITICAL CARE MEDICINE   Name: Caitlyn Wang MRN: [id number] DOB: [birth year]    ADMISSION DATE:  05/08/2014  SUBJECTIVE:  patient remains intubated, fio2 at 35% CXR with RT sided opacification s/p Bronch shows extensive purulant secretions and mucus plugs, remains critically ill, peep at 5, will attempt to wean fio2 and peep as tolerated and attempt SAT/SBT today  VITAL SIGNS: Temp:  [97.5 F (36.4 C)-99.1 F (37.3 C)] 97.6 F (36.4 C) (05/02 0811) Pulse Rate:  [69-110] 92 (05/02 0811) Resp:  [8-22] 18 (05/02 0811) BP: (100-143)/(48-74) 129/74 mmHg (05/02 0700) SpO2:  [91 %-100 %] 99 % (05/02 0811) FiO2 (%):  [35 %] 35 % (05/02 0920) Weight:  [135 lb 12.9 oz (61.6 kg)] 135 lb 12.9 oz (61.6 kg) (05/02 0426) HEMODYNAMICS:   VENTILATOR SETTINGS: Vent Mode:  [-] PSV FiO2 (%):  [35 %] 35 % Set Rate:  [18 bmp] 18 bmp Vt Set:  [450 mL] 450 mL PEEP:  [5 cmH20] 5 cmH20 Pressure Support:  [5 cmH20] 5 cmH20 Plateau Pressure:  [15 cmH20-17 cmH20] 17 cmH20 INTAKE / OUTPUT:  Intake/Output Summary (Last 24 hours) at 05/17/14 0929 Last data filed at 05/17/14 4098  Gross per 24 hour  Intake    739 ml  Output     40 ml  Net    699 ml    PHYSICAL EXAMINATION:  GEN thin, disheveled, critically ill appearing   HEENT pink conjunctivae, PERRL, dry oral mucosa, orally intubated   NECK supple  No masses   CARD regular rate  no murmur  no JVD   ABD denies tenderness  soft  normal BS   LYMPH negative neck   EXTR negative cyanosis/clubbing, positive edema, b/l UEs.   SKIN No rashes, skin turgor decreased   NEURO More awake today, following simple commands.    PSYCH More awake today   RESP postive use of accessory muscles  wheezing  rhonchi  on vent    LABS:  CBC  Recent Labs Lab 05/13/14 0417 05/14/14 0411 05/15/14 0428 05/17/14 0355  WBC 13.6* 13.9* 13.9*  --   HGB 10.9* 9.0* 8.7* 8.4*  HCT 34.4* 28.6* 26.8*  --   PLT 72* 53* 50*  --    Coag's No  results for input(s): APTT, INR in the last 168 hours. BMET  Recent Labs Lab 05/15/14 0428 05/15/14 1642 05/16/14 0730 05/17/14 0355  NA 139  --  139 138  K 3.3* 3.3* 3.9 4.1  CL 107  --  105 103  CO2 26  --  29 29  BUN 54*  --  39* 50*  CREATININE 3.18*  --  2.52* 3.23*  GLUCOSE 121*  --  125* 109*   Electrolytes  Recent Labs Lab 05/15/14 0428 05/16/14 0400 05/16/14 0730 05/17/14 0355  CALCIUM 8.3*  --  8.1* 8.5*  MG  --  1.6*  --  2.2  PHOS  --  1.6*  --  2.9   Sepsis Markers No results for input(s): LATICACIDVEN, PROCALCITON, O2SATVEN in the last 168 hours. ABG No results for input(s): PHART, PCO2ART, PO2ART in the last 168 hours. Liver Enzymes No results for input(s): AST, ALT, ALKPHOS, BILITOT, ALBUMIN in the last 168 hours. Cardiac Enzymes No results for input(s): TROPONINI, PROBNP in the last 168 hours. Glucose  Recent Labs Lab 05/16/14 0623 05/16/14 1124 05/16/14 1731 05/16/14 2132 05/17/14 0030 05/17/14 0601  GLUCAP 117* 123* 116* 129* 111* 109*    Imaging No results found.  ASSESSMENT / PLAN: 74 yo female with h.o smoking admitted for acute encephalopathy and acute hypoxic and hypercpanic resp failure from COPD exacerbation from acute RLL pneumonia complicated by acute renal failure.   1.respiratory failure will plan for SAT/SBT today -continue full vent support -follow up abg and cxr as needed -wean fio2 and peep as tolerated   2.encephlopathy- Altered mental status probably from sepsis and underlying pneumonia. -  -sedation as needed while on vent  3.right lower lobe pneumonia -emepric abx s/p bronch BAL sent for cultures  4. Acute kidney injury, possible due to ATN. worsening. cont NS iv, f/u BMP. -patient started on HD  I have personally obtained a history, examined the patient, evaluated Pertinent laboratory and [employer]/imaging results, and  formulated the assessment and plan   The Patient requires high complexity  decision making for assessment and support, frequent evaluation and titration of therapies, application of advanced monitoring technologies and extensive interpretation of multiple databases. Critical Care Time devoted to patient care services described in this note is 35 minutes.   Overall, patient is critically ill, prognosis is guarded.  Patient with Multiorgan failure and at high risk for cardiac arrest and death.    Corrin Parker, M.D. Pulmonary & [place] Director Intensive Care Unit

## 2014-05-17 NOTE — Progress Notes (Signed)
[facility]  ROUNDING NOTE   Subjective:   Intubated, sedated. Off fentanyl. On precedex UOP 5mL.  Last hemodialysis on Saturday.  On Vent: PRVC FiO 35%   Objective:  Vital signs in last 24 hours:  Temp:  [97.5 F (36.4 C)-99.1 F (37.3 C)] 97.6 F (36.4 C) (05/02 0811) Pulse Rate:  [69-110] 92 (05/02 0811) Resp:  [8-22] 18 (05/02 0811) BP: (100-143)/(48-74) 129/74 mmHg (05/02 0700) SpO2:  [91 %-100 %] 99 % (05/02 0811) FiO2 (%):  [35 %] 35 % (05/02 0811) Weight:  [61.6 kg (135 lb 12.9 oz)] 61.6 kg (135 lb 12.9 oz) (05/02 0426)  Weight change:  Filed Weights   05/17/14 0426  Weight: 61.6 kg (135 lb 12.9 oz)    Intake/Output: I/O last 3 completed shifts: In: 5701 [P.O.:100; I.V.:225; Other:29; NG/GT:835; IV Piggyback:100] Out: 32 [Urine:50]   Intake/Output this shift:  Total I/O In: 50 [IV Piggyback:50] Out: 0   General: critically ill, intubated, sedated Head: +ETT +OGT Neck: Left central line CVS: Regular rate, rhythm Resp: vent: PRBC FiO35%, diminished bilaterally ABD: soft, nontender EXT: peripheral weeping dependent edema Access: RIJ temp HD catheter.    Basic Metabolic Panel:  Recent Labs Lab 05/13/14 0417 05/14/14 0411 05/15/14 0100 05/15/14 0428 05/15/14 1642 05/16/14 0400 05/16/14 0730 05/17/14 0355  NA 139 139  --  139  --   --  139 138  K 4.2 3.6 3.8 3.3* 3.3*  --  3.9 4.1  CL 114* 109  --  107  --   --  105 103  CO2 20* 23  --  26  --   --  29 29  GLUCOSE 112* 118*  --  121*  --   --  125* 109*  BUN 59* 60*  --  54*  --   --  39* 50*  CREATININE 3.50* 3.51*  --  3.18*  --   --  2.52* 3.23*  CALCIUM 9.1 8.5*  --  8.3*  --   --  8.1* 8.5*  MG  --   --   --   --   --  1.6*  --  2.2  PHOS  --   --   --   --   --  1.6*  --  2.9    Liver Function Tests: No results for input(s): AST, ALT, ALKPHOS, BILITOT, PROT, ALBUMIN in the last 168 hours. No results for input(s): LIPASE, AMYLASE in the last 168 hours. No results for  input(s): AMMONIA in the last 168 hours.  CBC:  Recent Labs Lab 05/11/14 0638 05/12/14 0350 05/13/14 0417 05/14/14 0411 05/15/14 0428 05/17/14 0355  WBC 14.9* 11.4* 13.6* 13.9* 13.9*  --   NEUTROABS  --   --  12.2* 11.7* 11.2*  --   HGB 10.9* 11.5* 10.9* 9.0* 8.7* 8.4*  HCT 36.6 36.7 34.4* 28.6* 26.8*  --   MCV 95 90 90 88 87  --   PLT 106* 71* 72* 53* 50*  --     Cardiac Enzymes: No results for input(s): CKTOTAL, CKMB, CKMBINDEX, TROPONINI in the last 168 hours.  BNP: Invalid input(s): POCBNP  CBG:  Recent Labs Lab 05/16/14 1124 05/16/14 1731 05/16/14 2132 05/17/14 0030 05/17/14 0601  GLUCAP 123* 116* 129* 111* 109*    Microbiology: Results for orders placed or performed during the hospital encounter of 05/08/14  Culture, blood (single)     Status: None   Collection Time: 05/08/14  4:29 PM  Result Value Ref Range  Status   Micro Text Report   Final       COMMENT                   NO GROWTH AEROBICALLY IN 5 DAYS   ANTIBIOTIC                                                      Culture, blood (single)     Status: None   Collection Time: 05/08/14  4:37 PM  Result Value Ref Range Status   Micro Text Report   Final       COMMENT                   NO GROWTH AEROBICALLY/ANAEROBICALLY IN 5 DAYS   ANTIBIOTIC                                                      Urine culture     Status: None   Collection Time: 05/08/14  5:01 PM  Result Value Ref Range Status   Micro Text Report   Final       SOURCE: IN/OUT CATH    COMMENT                   NO GROWTH IN 36 HOURS   ANTIBIOTIC                                                      Bronchial Wash Culture     Status: None   Collection Time: 05/12/14  8:55 AM  Result Value Ref Range Status   Micro Text Report   Final       ORGANISM 1                HEAVY GROWTH CANDIDA ALBICANS   COMMENT                   -   GRAM STAIN                FAIR SPECIMEN-70-80% WBC   GRAM STAIN                MODERATE WHITE  BLOOD CELLS   GRAM STAIN                NO ORGANISMS SEEN   ANTIBIOTIC                    ORG#1                                               Culture, expectorated sputum-assessment     Status: None (Preliminary result)   Collection Time: 05/14/14 12:00 PM  Result Value Ref Range Status   Micro Text Report   Preliminary       SOURCE: ETS    ORGANISM 1  LIGHT GROWTH YEAST   COMMENT                   ID TO FOLLOW   GRAM STAIN                FEW WHITE BLOOD CELLS   GRAM STAIN                RARE GRAM POSITIVE COCCI   GRAM STAIN                POOR SPECIMEN-LESS THAN 70% WBC   ANTIBIOTIC                                                        Coagulation Studies: No results for input(s): LABPROT, INR in the last 72 hours.  Urinalysis: No results for input(s): COLORURINE, LABSPEC, PHURINE, GLUCOSEU, HGBUR, BILIRUBINUR, KETONESUR, PROTEINUR, UROBILINOGEN, NITRITE, LEUKOCYTESUR in the last 72 hours.  Invalid input(s): APPERANCEUR    Imaging: No results found.   Medications:   . dexmedetomidine 0.419 mcg/kg/hr (05/17/14 0800)  . feeding supplement (VITAL HIGH PROTEIN) Stopped (05/17/14 0800)  . fentaNYL infusion INTRAVENOUS Stopped (05/17/14 0800)   . antiseptic oral rinse  7 mL Mouth Rinse QID  . aspirin  81 mg Oral Daily  . budesonide (PULMICORT) nebulizer solution  0.5 mg Nebulization Q12H  . chlorhexidine  15 mL Mouth Rinse BID  . diltiazem  30 mg Oral 4 times per day  . docusate  100 mg Oral Daily  . famotidine  20 mg Oral Q48H  . gabapentin  600 mg Oral 3 times per day  . insulin aspart  0-10 Units Subcutaneous Q6H  . ipratropium-albuterol  3 mL Nebulization Q4H  . piperacillin-tazobactam (ZOSYN)  IV  3.375 g Intravenous Q12H  . sodium chloride  10 mL Intravenous Q12H   acetaminophen, fentaNYL (SUBLIMAZE) injection, hydrALAZINE, LORazepam, nitroGLYCERIN, ondansetron (ZOFRAN) IV, pneumococcal 23 valent vaccine, sodium chloride  Assessment/ Plan:   74 y.o. female with colon cancer status post colectomy, cerebral aneurysm status post clipping, hypertension, hyperlipidemia, chronic deconditioning.  Admitted 05/08/2014  Baseline Creatinine 06/19/2013 1.02* / GFR > 60 ([facility] records)  1. ARF, due to ATN: N17.0 anuric urine output. Required hemodialysis on Saturday.  - plan for hemodialysis later today. - Renally dose all medications - monitor volume status, urine output and serum electrolytes    2. Acute Respiratory Failure and Bacterial Pneumonia: with acute exacerbation of COPD and noncardiogenic pulmonary edema. J96.00, J15.9, J44.1, J81.1 Intubated.  - continue zosyn.  - consider steroids?  3. Hypotension: secondary to sepsis and pneumonia.  - not currently on vasopressors.     LOS: 9 Shay Bartoli 5/2/20169:09 AM

## 2014-05-17 NOTE — Discharge Summary (Signed)
Physician Discharge Summary  STORMY CONNON IOX:[id number] DOB: 1940/02/03 DOA: 05/08/2014  PCP: No PCP Per Patient  Admit date: 05/08/2014 Discharge date: 05/17/2014  Time spent: 35 minutes  Recommendations for Outpatient Follow-up:  1. [facility] when bed available  Discharge Diagnoses:  Active Problems:   Sepsis   Discharge Condition: Fair  Diet recommendation: To be determined by speech pathology  Filed Weights   05/17/14 0426  Weight: 61.6 kg (135 lb 12.9 oz)    History of present illness:  Patient admitted with septic shock and developed acute respiratory failure and was intubated.  Hospital Course:  Please see interim discharge summary dictated by Dr. Bridgett Larsson on 05/14/2014.  This is an addendum on patient's hospital course from that point.  #1 acute respiratory failure with hypoxia. Patient was extubated on 05/17/2014 and is now breathing comfortably on 4 L nasal cannula. Continued monitoring in CCU stepdown and needed. Hopefully tomorrow can be transferred out of the ICU. #2 septic shock with right lower lobe pneumonia. Patient currently on IV Zosyn. #3 acute renal failure Patient still requiring dialysis. Patient will have dialysis today on 05/17/2014. #4 elevated troponin-this is secondary to demand ischemia with acute respiratory failure and septic shock. #5 atrial fibrillation with rapid ventricular response. Patient is on oral Cardizem. #8 anemia and thrombocytopenia. Continue to watch blood counts closely. #9 hypomagnesemia This has been replaced IV during the hospital course. #10 weakness We will get a physical therapy consultation.  Procedures: None since the last discharge summary dictated by Dr. Bridgett Larsson. Consultations: Dr. Mortimer Fries pulmonology, Dr. Rolly Salter nephrology  Discharge Exam: Filed Vitals:   05/17/14 1300  BP: 133/73  Pulse: 102  Temp:   Resp: 11    Physical Exam: Please see today's note.  Discharge Instructions    Current Discharge  Medication List    CONTINUE these medications which have NOT CHANGED   Details  aspirin 81 MG tablet Take 81 mg by mouth daily.    atenolol (TENORMIN) 50 MG tablet Take 50 mg by mouth daily.    atorvastatin (LIPITOR) 10 MG tablet Take 10 mg by mouth daily.    Ergocalciferol (VITAMIN D2) 2000 UNITS TABS Take 1 tablet by mouth daily.    gabapentin (NEURONTIN) 300 MG capsule Take 600 mg by mouth 3 (three) times daily.     omeprazole (PRILOSEC OTC) 20 MG tablet Take 20 mg by mouth daily.    oxyCODONE (OXY IR/ROXICODONE) 5 MG immediate release tablet Take 5 mg by mouth every 4 (four) hours as needed for severe pain.    OxyCODONE (OXYCONTIN) 40 mg T12A 12 hr tablet Take 40 mg by mouth 2 (two) times daily.       No Known Allergies    The results of significant diagnostics from this hospitalization (including imaging, microbiology, ancillary and laboratory) are listed below for reference.    Significant Diagnostic Studies:   Microbiology: Recent Results (from the past 240 hour(s))  Culture, blood (single)     Status: None   Collection Time: 05/08/14  4:29 PM  Result Value Ref Range Status   Micro Text Report   Final       COMMENT                   NO GROWTH AEROBICALLY IN 5 DAYS   ANTIBIOTIC  Culture, blood (single)     Status: None   Collection Time: 05/08/14  4:37 PM  Result Value Ref Range Status   Micro Text Report   Final       COMMENT                   NO GROWTH AEROBICALLY/ANAEROBICALLY IN 5 DAYS   ANTIBIOTIC                                                      Urine culture     Status: None   Collection Time: 05/08/14  5:01 PM  Result Value Ref Range Status   Micro Text Report   Final       SOURCE: IN/OUT CATH    COMMENT                   NO GROWTH IN 36 HOURS   ANTIBIOTIC                                                      Bronchial Wash Culture     Status: None   Collection Time: 05/12/14  8:55 AM   Result Value Ref Range Status   Micro Text Report   Final       ORGANISM 1                HEAVY GROWTH CANDIDA ALBICANS   COMMENT                   -   GRAM STAIN                FAIR SPECIMEN-70-80% WBC   GRAM STAIN                MODERATE WHITE BLOOD CELLS   GRAM STAIN                NO ORGANISMS SEEN   ANTIBIOTIC                    ORG#1                                               Culture, expectorated sputum-assessment     Status: None   Collection Time: 05/14/14 12:00 PM  Result Value Ref Range Status   Micro Text Report   Final       SOURCE: ETS    ORGANISM 1                LIGHT GROWTH CANDIDA ALBICANS   COMMENT                   -   GRAM STAIN                FEW WHITE BLOOD CELLS   GRAM STAIN                RARE GRAM POSITIVE COCCI   GRAM STAIN  POOR SPECIMEN-LESS THAN 70% WBC   ANTIBIOTIC                    ORG#1                                                  Labs: Basic Metabolic Panel:  Recent Labs Lab 05/13/14 0417 05/14/14 0411 05/15/14 0100 05/15/14 0428 05/15/14 1642 05/16/14 0400 05/16/14 0730 05/17/14 0355  NA 139 139  --  139  --   --  139 138  K 4.2 3.6 3.8 3.3* 3.3*  --  3.9 4.1  CL 114* 109  --  107  --   --  105 103  CO2 20* 23  --  26  --   --  29 29  GLUCOSE 112* 118*  --  121*  --   --  125* 109*  BUN 59* 60*  --  54*  --   --  39* 50*  CREATININE 3.50* 3.51*  --  3.18*  --   --  2.52* 3.23*  CALCIUM 9.1 8.5*  --  8.3*  --   --  8.1* 8.5*  MG  --   --   --   --   --  1.6*  --  2.2  PHOS  --   --   --   --   --  1.6*  --  2.9   CBC:  Recent Labs Lab 05/11/14 0638 05/12/14 0350 05/13/14 0417 05/14/14 0411 05/15/14 0428 05/17/14 0355  WBC 14.9* 11.4* 13.6* 13.9* 13.9*  --   NEUTROABS  --   --  12.2* 11.7* 11.2*  --   HGB 10.9* 11.5* 10.9* 9.0* 8.7* 8.4*  HCT 36.6 36.7 34.4* 28.6* 26.8*  --   MCV 95 90 90 88 87  --   PLT 106* 71* 72* 53* 50*  --      CBG:  Recent Labs Lab 05/16/14 1124 05/16/14 1731  05/16/14 2132 05/17/14 0030 05/17/14 0601  GLUCAP 123* 116* 129* 111* 109*       Signed:  [name]  05/17/2014, 1:57 PM

## 2014-05-17 NOTE — Plan of Care (Signed)
Problem: Phase I Progression Outcomes Goal: Pneumonia/flu vaccination screen completed Outcome: Not Met (add Reason) confused Goal: Pain controlled with appropriate interventions Outcome: Progressing Oxycodone started Goal: Baseline oxygen/pH stable Outcome: Progressing ABG improved. extubated Goal: Patient tolerating nututrition at goal Outcome: Progressing Started on po diet. Poor appetite. Seen by SLT regarding swallow Goal: Voiding-avoid urinary catheter unless indicated Outcome: Not Met (add Reason) Acute renal failure. Monitoring close I&O  Problem: Phase II Progression Outcomes Goal: Time pt extubated/weaned off vent Outcome: Completed/Met Date Met:  05/17/14 1100

## 2014-05-17 NOTE — Care Management (Signed)
Patient extubated today and on 40% aerosol mask

## 2014-05-17 NOTE — Progress Notes (Signed)
Initial Nutrition Assessment  DOCUMENTATION CODES:     INTERVENTION:   Coordination of care: Discussed in ICU rounds, Dr. Mortimer Fries to order SLP evaluation  NUTRITION DIAGNOSIS:  Inadequate oral intake related to inability to eat as evidenced by NPO status.    GOAL:   Pt to be able to consume po diet within 24-48 hr  MONITOR:  Energy intake:    REASON FOR ASSESSMENT:  Other (Comment)    ASSESSMENT:  See previous assessment and notes in SCM.  Pt extubated today, planning HD and SLP consult  Electrolyte and renal profile: BUN 50, creatinine 3.32, Ca 8.5, phosphorus 2.9, Mg 2.2 Glucose profile: glucose 109 Nutritional anemia profile: Hgb 8.4  Medications reviewed  Height:  Ht Readings from Last 1 Encounters:  No data found for Ht    Weight:  Wt Readings from Last 1 Encounters:  05/17/14 123 lb 3.8 oz (55.9 kg)         Wt Readings from Last 10 Encounters:  05/17/14 123 lb 3.8 oz (55.9 kg)    BMI:  Body mass index is 25.76 kg/(m^2).  Estimated Nutritional Needs:  Kcal:  1552-0802 kcals/d (BEE 959 kcals (IF 1.0-1.3, AF 1.2)   Protein:  67-84 g/d (1.2-1.5 g/kg)  Fluid:  1400-1674ml/d (25-51ml/kg)  Skin:  Wound (see comment)  Diet Order:  Diet NPO time specified  EDUCATION NEEDS:  No education needs identified at this time   Intake/Output Summary (Last 24 hours) at 05/17/14 1639 Last data filed at 05/17/14 1600  Gross per 24 hour  Intake 802.98 ml  Output     15 ml  Net 787.98 ml    Last BM:  Large loose today at 16:00  Moderate care level  Numa Schroeter B. Zenia Resides, [place], [place] (pager)

## 2014-05-27 DIAGNOSIS — H9190 Unspecified hearing loss, unspecified ear: Secondary | ICD-10-CM | POA: Insufficient documentation

## 2014-08-11 DIAGNOSIS — N182 Chronic kidney disease, stage 2 (mild): Secondary | ICD-10-CM | POA: Insufficient documentation

## 2015-10-03 IMAGING — CR DG CHEST 1V PORT
1 series · 1 of 1 positions shown · non-contrast
Comparison: [DATE] [DATE], [DATE].  [DATE] [DATE], [DATE].

CLINICAL DATA: Pneumonia, respiratory failure.

EXAM:
PORTABLE CHEST - 1 VIEW

[ap]
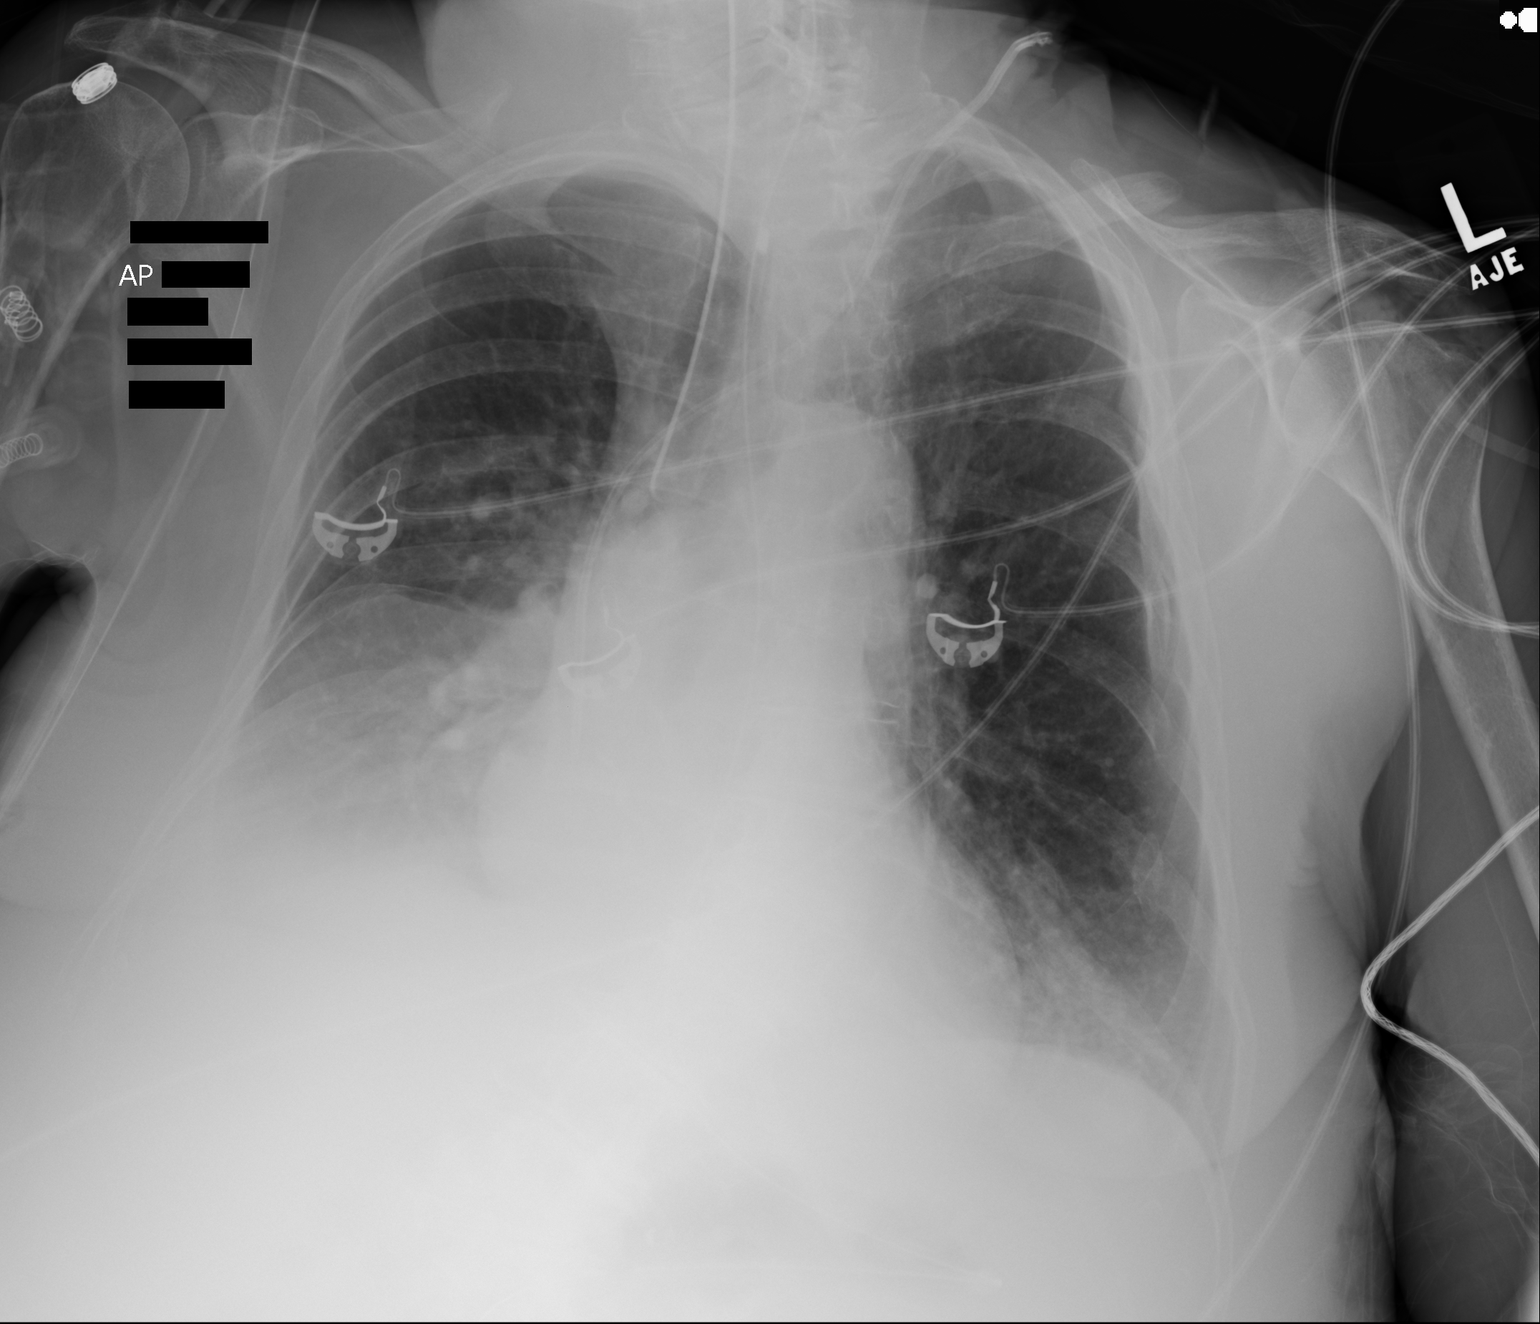

[1 of 1 positions shown; findings below may reference images not displayed]

FINDINGS: Stable cardiomediastinal silhouette. No pneumothorax is noted.
Stable old left rib fractures are noted. Endotracheal nasogastric
tubes are unchanged in position. Left internal jugular catheter is
noted with distal tip overlying expected position of cavoatrial
junction. Stable right basilar opacity is noted concerning for
atelectasis or pneumonia with associated pleural effusion.
IMPRESSION: Stable support apparatus. Stable right basilar opacity concerning
for pneumonia or atelectasis with associated pleural effusion.

## 2016-02-08 DIAGNOSIS — M159 Polyosteoarthritis, unspecified: Secondary | ICD-10-CM | POA: Diagnosis not present

## 2016-02-08 DIAGNOSIS — Z79891 Long term (current) use of opiate analgesic: Secondary | ICD-10-CM | POA: Diagnosis not present

## 2016-04-27 DIAGNOSIS — M545 Low back pain: Secondary | ICD-10-CM | POA: Diagnosis not present

## 2016-04-27 DIAGNOSIS — M791 Myalgia: Secondary | ICD-10-CM | POA: Diagnosis not present

## 2016-05-02 DIAGNOSIS — J449 Chronic obstructive pulmonary disease, unspecified: Secondary | ICD-10-CM | POA: Diagnosis not present

## 2016-05-02 DIAGNOSIS — Z79891 Long term (current) use of opiate analgesic: Secondary | ICD-10-CM | POA: Diagnosis not present

## 2016-07-27 DIAGNOSIS — M791 Myalgia: Secondary | ICD-10-CM | POA: Diagnosis not present

## 2016-07-27 DIAGNOSIS — M545 Low back pain: Secondary | ICD-10-CM | POA: Diagnosis not present

## 2016-07-31 DIAGNOSIS — Z79891 Long term (current) use of opiate analgesic: Secondary | ICD-10-CM | POA: Diagnosis not present

## 2016-07-31 DIAGNOSIS — M1990 Unspecified osteoarthritis, unspecified site: Secondary | ICD-10-CM | POA: Diagnosis not present

## 2016-07-31 DIAGNOSIS — J449 Chronic obstructive pulmonary disease, unspecified: Secondary | ICD-10-CM | POA: Diagnosis not present

## 2016-10-22 DIAGNOSIS — M791 Myalgia, unspecified site: Secondary | ICD-10-CM | POA: Diagnosis not present

## 2016-10-24 DIAGNOSIS — Z23 Encounter for immunization: Secondary | ICD-10-CM | POA: Diagnosis not present

## 2016-10-24 DIAGNOSIS — M159 Polyosteoarthritis, unspecified: Secondary | ICD-10-CM | POA: Diagnosis not present

## 2016-10-24 DIAGNOSIS — J449 Chronic obstructive pulmonary disease, unspecified: Secondary | ICD-10-CM | POA: Diagnosis not present

## 2016-11-15 DIAGNOSIS — H40003 Preglaucoma, unspecified, bilateral: Secondary | ICD-10-CM | POA: Diagnosis not present

## 2016-11-15 DIAGNOSIS — H04123 Dry eye syndrome of bilateral lacrimal glands: Secondary | ICD-10-CM | POA: Diagnosis not present

## 2016-11-15 DIAGNOSIS — H2513 Age-related nuclear cataract, bilateral: Secondary | ICD-10-CM | POA: Diagnosis not present

## 2016-11-15 DIAGNOSIS — H43393 Other vitreous opacities, bilateral: Secondary | ICD-10-CM | POA: Diagnosis not present

## 2016-12-21 DIAGNOSIS — M791 Myalgia, unspecified site: Secondary | ICD-10-CM | POA: Diagnosis not present

## 2017-02-15 DIAGNOSIS — M5126 Other intervertebral disc displacement, lumbar region: Secondary | ICD-10-CM | POA: Diagnosis not present

## 2017-02-15 DIAGNOSIS — G894 Chronic pain syndrome: Secondary | ICD-10-CM | POA: Diagnosis not present

## 2017-02-15 DIAGNOSIS — M48061 Spinal stenosis, lumbar region without neurogenic claudication: Secondary | ICD-10-CM | POA: Diagnosis not present

## 2017-02-15 DIAGNOSIS — F09 Unspecified mental disorder due to known physiological condition: Secondary | ICD-10-CM | POA: Diagnosis not present

## 2017-05-02 DIAGNOSIS — K119 Disease of salivary gland, unspecified: Secondary | ICD-10-CM | POA: Diagnosis not present

## 2017-05-02 DIAGNOSIS — G894 Chronic pain syndrome: Secondary | ICD-10-CM | POA: Diagnosis not present

## 2017-05-02 DIAGNOSIS — M48061 Spinal stenosis, lumbar region without neurogenic claudication: Secondary | ICD-10-CM | POA: Diagnosis not present

## 2017-05-02 DIAGNOSIS — M5126 Other intervertebral disc displacement, lumbar region: Secondary | ICD-10-CM | POA: Diagnosis not present

## 2017-06-21 ENCOUNTER — Ambulatory Visit: Payer: Self-pay | Admitting: Family Medicine

## 2017-07-11 DIAGNOSIS — G894 Chronic pain syndrome: Secondary | ICD-10-CM | POA: Diagnosis not present

## 2017-07-11 DIAGNOSIS — M48061 Spinal stenosis, lumbar region without neurogenic claudication: Secondary | ICD-10-CM | POA: Diagnosis not present

## 2017-07-11 DIAGNOSIS — M5126 Other intervertebral disc displacement, lumbar region: Secondary | ICD-10-CM | POA: Diagnosis not present

## 2017-08-22 ENCOUNTER — Ambulatory Visit (INDEPENDENT_AMBULATORY_CARE_PROVIDER_SITE_OTHER): Payer: Medicare Other | Admitting: Family Medicine

## 2017-08-22 ENCOUNTER — Encounter: Payer: Self-pay | Admitting: Family Medicine

## 2017-08-22 VITALS — BP 148/70 | HR 86 | Temp 98.4°F | Resp 16 | Ht <= 58 in | Wt 86.0 lb

## 2017-08-22 DIAGNOSIS — I10 Essential (primary) hypertension: Secondary | ICD-10-CM

## 2017-08-22 DIAGNOSIS — J449 Chronic obstructive pulmonary disease, unspecified: Secondary | ICD-10-CM | POA: Diagnosis not present

## 2017-08-22 DIAGNOSIS — E785 Hyperlipidemia, unspecified: Secondary | ICD-10-CM | POA: Insufficient documentation

## 2017-08-22 DIAGNOSIS — Z8619 Personal history of other infectious and parasitic diseases: Secondary | ICD-10-CM | POA: Diagnosis not present

## 2017-08-22 DIAGNOSIS — R627 Adult failure to thrive: Secondary | ICD-10-CM | POA: Insufficient documentation

## 2017-08-22 DIAGNOSIS — G8929 Other chronic pain: Secondary | ICD-10-CM

## 2017-08-22 DIAGNOSIS — M549 Dorsalgia, unspecified: Secondary | ICD-10-CM | POA: Diagnosis not present

## 2017-08-22 DIAGNOSIS — F339 Major depressive disorder, recurrent, unspecified: Secondary | ICD-10-CM | POA: Diagnosis not present

## 2017-08-22 NOTE — Progress Notes (Signed)
Patient: Caitlyn Wang, Female    DOB: October 10, 1940, 77 y.o.   MRN: [id number] Visit Date: 08/23/2017  Today's Provider: Lavon Paganini, MD   I, Martha Clan, CMA, am acting as scribe for Lavon Paganini, MD.  Chief Complaint  Patient presents with  . New Patient (Initial Visit)   Subjective:    New Patient Caitlyn Wang is a 77 y.o. female who presents today as a new patient to establish care. She feels well. She reports exercising "not much, but I would like more exercise". She will be swimming at [facility]. She reports she is sleeping well.  Dr Lockie Mola at [facility] in [place] was previously PCP who moved to [facility].  [facility] referred her here for a new PCP.  Patient is here today with her healthcare power of attorney who helps with all of her finances, appointments, and medications.  She is followed by [facility] for cataracts.  She may have cataract surgery soon.  COPD: Patient and her power of attorney report that this is been very well controlled since she quit smoking.  She quit smoking about 1 year ago.  Prior to that, she was having exacerbations quite frequently and required home O2.  She is no longer on oxygen therapy.  She is not currently on any controller inhalers.  She has not needed a rescue inhaler in several months.  HTN, HLD: Taking amlodipine 5mg  daily, atenolol 50mg  daily, diltiazem 300mg  daily, aspirin 81 mg daily, atorvastatin 20 mg daily.  Previous PCP was prescribing these.  She denies any chest pain, shortness of breath, lower extremity edema, medication side effects.  She is taking her medications with good compliance.  Her home blood pressures are well controlled, even though she is often elevated when she is seen at a doctor's office.  She has an upcoming appointment with a neurologist at [facility] about her memory.  They have noticed some improvement in her cognition since she has been decreasing her pain  medications and since she quit smoking.  She does continue to be forgetful and have the same conversations over and over again with her caretaker.  She has a history of multiple spinal surgeries.  She is followed by Dr. Luetta Nutting at [facility] in [place].  He manages her pain.  She is currently taking OxyContin 30 mg 3 times daily and trying to wean to twice daily.  She also takes OxyIR 5 mg 3 times daily as needed for breakthrough pain.  Her caretaker tries to limit the number of times she takes her breakthrough medication.  They have noticed that her overall pain level has decreased as she is decreased on these medications.  Patient is taking Remeron nightly for failure to thrive, depression, and insomnia.  Though she still has a low weight currently, it is much better than it was about a year ago.  Decreasing her pain medications and starting Remeron as well as controlling her COPD has helped with her failure to thrive significantly.  She states that her depression is well controlled.  She denies any medication side effects. -----------------------------------------------------------------   Review of Systems  Constitutional: Negative.   HENT: Positive for hearing loss. Negative for congestion, dental problem, drooling, ear discharge, ear pain, facial swelling, mouth sores, nosebleeds, postnasal drip, rhinorrhea, sinus pressure, sinus pain, sneezing, sore throat, tinnitus, trouble swallowing and voice change.   Eyes: Positive for discharge, redness and visual disturbance. Negative for photophobia, pain and itching.  Respiratory: Negative.   Gastrointestinal: Negative.   Endocrine: Negative.   Genitourinary: Negative.   Musculoskeletal: Positive for back pain. Negative for arthralgias, gait problem, joint swelling, myalgias, neck pain and neck stiffness.  Skin: Negative.   Allergic/Immunologic: Negative.   Neurological: Negative.   Hematological: Negative.   Psychiatric/Behavioral: Negative.      Social History      She  reports that she quit smoking about 14 months ago. Her smoking use included cigarettes. She has a 40.00 pack-year smoking history. She has never used smokeless tobacco. She reports that she drank alcohol. She reports that she has current or past drug history.       Social History   Socioeconomic History  . Marital status: Single    Spouse name: Not on file  . Number of children: Not on file  . Years of education: Not on file  . Highest education level: Not on file  Occupational History  . Not on file  Social Needs  . Financial resource strain: Not on file  . Food insecurity:    Worry: Not on file    Inability: Not on file  . Transportation needs:    Medical: Not on file    Non-medical: Not on file  Tobacco Use  . Smoking status: Former Smoker    Packs/day: 1.00    Years: 40.00    Pack years: 40.00    Types: Cigarettes    Last attempt to quit: 06/15/2016    Years since quitting: 1.1  . Smokeless tobacco: Never Used  Substance and Sexual Activity  . Alcohol use: Not Currently  . Drug use: Not Currently  . Sexual activity: Not on file  Lifestyle  . Physical activity:    Days per week: Not on file    Minutes per session: Not on file  . Stress: Not on file  Relationships  . Social connections:    Talks on phone: Not on file    Gets together: Not on file    Attends religious service: Not on file    Active member of club or organization: Not on file    Attends meetings of clubs or organizations: Not on file    Relationship status: Not on file  Other Topics Concern  . Not on file  Social History Narrative  . Not on file    Past Medical History:  Diagnosis Date  . Arthritis   . Cataract   . COPD (chronic obstructive pulmonary disease) ([place])   . Hypertension   . Neuromuscular disorder ([place])   . Oxygen deficiency      Patient Active Problem List   Diagnosis Date Noted  . Hypertension 08/22/2017  . Hyperlipidemia 08/22/2017  .  Depression, recurrent ([place]) 08/22/2017  . Failure to thrive in adult 08/22/2017  . Chronic back pain 08/22/2017    Past Surgical History:  Procedure Laterality Date  . COLON SURGERY     unclear reason  . JOINT REPLACEMENT    . [place] History        Family Status  Relation Name Status  . Mat Aunt  (Not Specified)  . Mother  (Not Specified)  . Neg Hx  (Not Specified)        Her family history includes COPD in her mother; Memory loss in her maternal aunt. There is no history of Breast cancer.      Allergies  Allergen Reactions  . Bupropion Nausea Only  . Penicillins  Current Outpatient Medications:  .  amLODipine (NORVASC) 5 MG tablet, Take 1 tablet by mouth daily., Disp: , Rfl: 4 .  aspirin 81 MG tablet, Take 81 mg by mouth daily., Disp: , Rfl:  .  atenolol (TENORMIN) 50 MG tablet, Take 50 mg by mouth daily., Disp: , Rfl:  .  atorvastatin (LIPITOR) 20 MG tablet, Take 1 tablet by mouth daily., Disp: , Rfl: 5 .  diltiazem (CARDIZEM CD) 300 MG 24 hr capsule, Take 1 capsule by mouth daily., Disp: , Rfl: 2 .  folic acid (FOLVITE) 1 MG tablet, Take 1 tablet by mouth daily., Disp: , Rfl:  .  mirtazapine (REMERON) 30 MG tablet, Take 1 tablet by mouth daily., Disp: , Rfl: 5 .  oxycodone (OXY-IR) 5 MG capsule, Take 5 mg by mouth 3 (three) times daily as needed., Disp: , Rfl:  .  oxyCODONE 30 MG 12 hr tablet, Take 30 mg by mouth 3 (three) times daily., Disp: , Rfl:  .  predniSONE (DELTASONE) 10 MG tablet, 1 taper pack x 12 days PRN for COPD exacerbation, Disp: , Rfl: 5 .  traZODone (DESYREL) 50 MG tablet, Take 1 tablet by mouth at bedtime., Disp: , Rfl: 4   Patient Care Team: Virginia Crews, MD as PCP - General (Family Medicine)      Objective:   Vitals: BP (!) 148/70 (BP Location: Right Arm, Patient Position: Sitting, Cuff Size: Normal)   Pulse 86   Temp 98.4 F (36.9 C) (Oral)   Resp 16   Ht 4' 7.5" (1.41 m)   Wt 86 lb (39 kg)   SpO2 95%    BMI 19.63 kg/m    Vitals:   08/22/17 1423  BP: (!) 148/70  Pulse: 86  Resp: 16  Temp: 98.4 F (36.9 C)  TempSrc: Oral  SpO2: 95%  Weight: 86 lb (39 kg)  Height: 4' 7.5" (1.41 m)     Physical Exam  Constitutional: She is oriented to person, place, and time. She appears well-developed and well-nourished. No distress.  HENT:  Head: Normocephalic and atraumatic.  Right Ear: External ear normal.  Left Ear: External ear normal.  Nose: Nose normal.  Mouth/Throat: Oropharynx is clear and moist.  Eyes: Pupils are equal, round, and reactive to light. Conjunctivae and EOM are normal. No scleral icterus.  Neck: Neck supple. No thyromegaly present.  Cardiovascular: Normal rate, regular rhythm, normal heart sounds and intact distal pulses.  No murmur heard. Pulmonary/Chest: Effort normal and breath sounds normal. No respiratory distress. She has no wheezes. She has no rales.  Abdominal: Soft. Bowel sounds are normal. She exhibits no distension. There is no tenderness. There is no rebound and no guarding.  Musculoskeletal: She exhibits no edema.  Significant thoracic kyphosis.  Lymphadenopathy:    She has no cervical adenopathy.  Neurological: She is alert and oriented to person, place, and time. No cranial nerve deficit. She exhibits normal muscle tone.  Skin: Skin is warm and dry. Capillary refill takes less than 2 seconds. No rash noted.  Psychiatric: She has a normal mood and affect. Her behavior is normal.  Vitals reviewed.    Depression Screen PHQ 2/9 Scores 08/22/2017  PHQ - 2 Score 0     Assessment & Plan:    Problem List Items Addressed This Visit      Cardiovascular and Mediastinum   Hypertension - Primary    Elevated today, but continues to be well controlled at home Suspect she has some degree of whitecoat  hypertension We will continue her current medications at current dose-amlodipine 5 mg daily, atenolol 50 mg daily, diltiazem 300 mg daily Recheck metabolic panel  today Follow-up in 3 months      Relevant Medications   diltiazem (CARDIZEM CD) 300 MG 24 hr capsule   atorvastatin (LIPITOR) 20 MG tablet   amLODipine (NORVASC) 5 MG tablet   Other Relevant Orders   Comprehensive metabolic panel     Respiratory   COPD (chronic obstructive pulmonary disease) ([place])    Long-standing issue Currently well controlled Currently not using any controller medications We will request records from previous PCP to see what has been tried in the past Discussed return precautions including signs of his exacerbation If she has another exacerbation, I would definitely consider putting her on a controller medicine such as Spiriva She was previously on home oxygen therapy, but is no longer needing this and is satting well today      Relevant Medications   predniSONE (DELTASONE) 10 MG tablet     Other   Hyperlipidemia    Previous control is unknown Continue Lipitor 20 mg daily currently Recheck CMP and lipid panel today We will request records from previous PCP      Relevant Medications   diltiazem (CARDIZEM CD) 300 MG 24 hr capsule   atorvastatin (LIPITOR) 20 MG tablet   amLODipine (NORVASC) 5 MG tablet   Other Relevant Orders   Lipid panel   Comprehensive metabolic panel   Depression, recurrent (HCC)    Stable and well-controlled Continue Remeron at current dose Continue to monitor We will request records from previous PCP      Relevant Medications   traZODone (DESYREL) 50 MG tablet   mirtazapine (REMERON) 30 MG tablet   Failure to thrive in adult    Well-controlled currently with BMI 19.6 Weight was reportedly around 60 to 65 pounds last year  continue Remeron at current dose as appetite seems to be good      Chronic back pain    Managed by orthopedics No changes to medications They are working toward cutting back on her total narcotic dose They know that this increases her fall risk and risk of confusion      Relevant Medications    predniSONE (DELTASONE) 10 MG tablet   oxycodone (OXY-IR) 5 MG capsule   oxyCODONE 30 MG 12 hr tablet    Other Visit Diagnoses    History of sepsis       Relevant Orders   CBC       Return in about 3 months (around 11/22/2017) for chronic disease f/u.   The entirety of the information documented in the History of Present Illness, Review of Systems and Physical Exam were personally obtained by me. Portions of this information were initially documented by Raquel Sarna Ratchford, CMA and reviewed by me for thoroughness and accuracy.    Virginia Crews, MD, MPH [facility] 08/23/2017 2:25 PM

## 2017-08-23 DIAGNOSIS — J449 Chronic obstructive pulmonary disease, unspecified: Secondary | ICD-10-CM | POA: Insufficient documentation

## 2017-08-23 NOTE — Assessment & Plan Note (Signed)
Well-controlled currently with BMI 19.6 Weight was reportedly around 60 to 65 pounds last year  continue Remeron at current dose as appetite seems to be good

## 2017-08-23 NOTE — Assessment & Plan Note (Signed)
Elevated today, but continues to be well controlled at home Suspect she has some degree of whitecoat hypertension We will continue her current medications at current dose-amlodipine 5 mg daily, atenolol 50 mg daily, diltiazem 300 mg daily Recheck metabolic panel today Follow-up in 3 months

## 2017-08-23 NOTE — Assessment & Plan Note (Signed)
Long-standing issue Currently well controlled Currently not using any controller medications We will request records from previous PCP to see what has been tried in the past Discussed return precautions including signs of his exacerbation If she has another exacerbation, I would definitely consider putting her on a controller medicine such as Spiriva She was previously on home oxygen therapy, but is no longer needing this and is satting well today

## 2017-08-23 NOTE — Assessment & Plan Note (Signed)
Stable and well-controlled Continue Remeron at current dose Continue to monitor We will request records from previous PCP

## 2017-08-23 NOTE — Assessment & Plan Note (Signed)
Managed by orthopedics No changes to medications They are working toward cutting back on her total narcotic dose They know that this increases her fall risk and risk of confusion

## 2017-08-23 NOTE — Assessment & Plan Note (Signed)
Previous control is unknown Continue Lipitor 20 mg daily currently Recheck CMP and lipid panel today We will request records from previous PCP

## 2017-10-10 DIAGNOSIS — M5126 Other intervertebral disc displacement, lumbar region: Secondary | ICD-10-CM | POA: Diagnosis not present

## 2017-10-10 DIAGNOSIS — G894 Chronic pain syndrome: Secondary | ICD-10-CM | POA: Diagnosis not present

## 2017-10-10 DIAGNOSIS — M48061 Spinal stenosis, lumbar region without neurogenic claudication: Secondary | ICD-10-CM | POA: Diagnosis not present

## 2017-10-16 ENCOUNTER — Telehealth: Payer: Self-pay | Admitting: Family Medicine

## 2017-11-22 ENCOUNTER — Ambulatory Visit: Payer: Self-pay | Admitting: Family Medicine

## 2017-11-28 ENCOUNTER — Ambulatory Visit: Payer: Self-pay | Admitting: Family Medicine

## 2017-12-06 ENCOUNTER — Ambulatory Visit (INDEPENDENT_AMBULATORY_CARE_PROVIDER_SITE_OTHER): Payer: Medicare Other | Admitting: Family Medicine

## 2017-12-06 ENCOUNTER — Encounter: Payer: Self-pay | Admitting: Family Medicine

## 2017-12-06 VITALS — BP 144/62 | HR 93 | Temp 98.6°F | Wt 88.0 lb

## 2017-12-06 DIAGNOSIS — E785 Hyperlipidemia, unspecified: Secondary | ICD-10-CM | POA: Diagnosis not present

## 2017-12-06 DIAGNOSIS — F339 Major depressive disorder, recurrent, unspecified: Secondary | ICD-10-CM

## 2017-12-06 DIAGNOSIS — Z23 Encounter for immunization: Secondary | ICD-10-CM

## 2017-12-06 DIAGNOSIS — I480 Paroxysmal atrial fibrillation: Secondary | ICD-10-CM | POA: Diagnosis not present

## 2017-12-06 DIAGNOSIS — I10 Essential (primary) hypertension: Secondary | ICD-10-CM | POA: Diagnosis not present

## 2017-12-06 DIAGNOSIS — R627 Adult failure to thrive: Secondary | ICD-10-CM | POA: Diagnosis not present

## 2017-12-06 DIAGNOSIS — K1120 Sialoadenitis, unspecified: Secondary | ICD-10-CM | POA: Diagnosis not present

## 2017-12-06 DIAGNOSIS — N182 Chronic kidney disease, stage 2 (mild): Secondary | ICD-10-CM | POA: Diagnosis not present

## 2017-12-06 DIAGNOSIS — Z8619 Personal history of other infectious and parasitic diseases: Secondary | ICD-10-CM | POA: Diagnosis not present

## 2017-12-06 DIAGNOSIS — J449 Chronic obstructive pulmonary disease, unspecified: Secondary | ICD-10-CM | POA: Diagnosis not present

## 2017-12-06 MED ORDER — DILTIAZEM HCL ER COATED BEADS 300 MG PO CP24: 300.0000 mg | ORAL_CAPSULE | Freq: Every day | ORAL | 3 refills | Status: DC

## 2017-12-06 MED ORDER — ATENOLOL 50 MG PO TABS: 50.0000 mg | ORAL_TABLET | Freq: Every day | ORAL | 3 refills | Status: DC

## 2017-12-06 MED ORDER — TIOTROPIUM BROMIDE MONOHYDRATE 18 MCG IN CAPS: 18.0000 ug | ORAL_CAPSULE | Freq: Every day | RESPIRATORY_TRACT | 3 refills | Status: AC

## 2017-12-06 MED ORDER — MIRTAZAPINE 30 MG PO TABS: 30.0000 mg | ORAL_TABLET | Freq: Every day | ORAL | 3 refills | Status: AC

## 2017-12-06 MED ORDER — AMLODIPINE BESYLATE 5 MG PO TABS: 5.0000 mg | ORAL_TABLET | Freq: Every day | ORAL | 3 refills | Status: DC

## 2017-12-06 MED ORDER — FOLIC ACID 1 MG PO TABS: 1.0000 mg | ORAL_TABLET | Freq: Every day | ORAL | 3 refills | Status: DC

## 2017-12-06 MED ORDER — DOXYCYCLINE HYCLATE 100 MG PO TABS: 100.0000 mg | ORAL_TABLET | Freq: Two times a day (BID) | ORAL | 0 refills | Status: AC

## 2017-12-06 MED ORDER — ATORVASTATIN CALCIUM 20 MG PO TABS: 20.0000 mg | ORAL_TABLET | Freq: Every day | ORAL | 3 refills | Status: DC

## 2017-12-06 MED ORDER — TRAZODONE HCL 50 MG PO TABS: 50.0000 mg | ORAL_TABLET | Freq: Every day | ORAL | 3 refills | Status: AC

## 2017-12-06 NOTE — Assessment & Plan Note (Signed)
Did not have labs after her last visit Recheck metabolic panel today Avoid nephrotoxic medications

## 2017-12-06 NOTE — Progress Notes (Signed)
Patient: Caitlyn Wang Female    DOB: February 23, 1940   77 y.o.   MRN: [id number] Visit Date: 12/06/2017  Today's Provider: Lavon Paganini, MD   Chief Complaint  Patient presents with  . Hypertension  . Hyperlipidemia  . Depression   Subjective:    HPI  Hypertension, follow-up:  BP Readings from Last 3 Encounters:  12/06/17 (!) 144/62  08/22/17 (!) 148/70  05/17/14 (!) 151/68    She was last seen for hypertension 3 months ago.  BP at that visit was 148/70. Management since that visit includes no changes continue medications at current dose .She reports good compliance with treatment. She is not having side effects.  She is not exercising. She is adherent to low salt diet.   Outside blood pressures are being checked at home. She is experiencing none.  Patient denies chest pain, chest pressure/discomfort, claudication, dyspnea, exertional chest pressure/discomfort, fatigue, irregular heart beat, lower extremity edema, near-syncope, orthopnea, palpitations, paroxysmal nocturnal dyspnea, syncope and tachypnea.   Cardiovascular risk factors include advanced age (older than 86 for men, 42 for women), dyslipidemia and hypertension.  Use of agents associated with hypertension: none.   ------------------------------------------------------------------------   Lipid/Cholesterol, Follow-up:   Last seen for this 3 months ago.  Management since that visit includes no change, continue medication  Last Lipid Panel:    Component Value Date/Time   CHOL 101 05/09/2014 0827   TRIG 80 05/09/2014 0827   HDL 41 05/09/2014 0827   VLDL 16 05/09/2014 0827   LDLCALC 44 05/09/2014 0827    She reports good compliance with treatment. She is not having side effects.   Wt Readings from Last 3 Encounters:  12/06/17 88 lb (39.9 kg)  08/22/17 86 lb (39 kg)  05/17/14 122 lb 2.2 oz (55.4 kg)    ------------------------------------------------------------------------ Depression,  FTT:  Patient presents for a 3 month follow up. Last OV was on 08/22/2017. Patient advised to continue Trazodone and Remeron. She reports good compliance with treatment plan. She states symptoms are stable. Weight is stable. Appetite is good.  Patient is concerned about swollen glands on the left side for trauma.  This has been coming up on both sides for about 9 months intermittently.  It is tender to palpation.  She has never had anything like this previously.  She denies any fevers.    COPD: Patient is improving but had episode of cough, congestion, wheezing last week.  They called in for prednisone taper and did not want to be seen.  This was denied and they were advised to be seen.  She feels as though she is getting better.  She did not end up taking prednisone.  She did notice preventive during the exacerbation.  She has had about 3 exacerbations this year.  Is much better than she used to be, since she quit smoking in between exacerbations, she does not use her Spiriva.  She was previously on home O2, but is no longer.      Allergies  Allergen Reactions  . Bupropion Nausea Only  . Penicillins      Current Outpatient Medications:  .  amLODipine (NORVASC) 5 MG tablet, Take 1 tablet by mouth daily., Disp: , Rfl: 4 .  aspirin 81 MG tablet, Take 81 mg by mouth daily., Disp: , Rfl:  .  atenolol (TENORMIN) 50 MG tablet, Take 50 mg by mouth daily., Disp: , Rfl:  .  atorvastatin (LIPITOR) 20 MG tablet, Take 1 tablet by mouth  daily., Disp: , Rfl: 5 .  diltiazem (CARDIZEM CD) 300 MG 24 hr capsule, Take 1 capsule by mouth daily., Disp: , Rfl: 2 .  folic acid (FOLVITE) 1 MG tablet, Take 1 tablet by mouth daily., Disp: , Rfl:  .  mirtazapine (REMERON) 30 MG tablet, Take 1 tablet by mouth daily., Disp: , Rfl: 5 .  oxycodone (OXY-IR) 5 MG capsule, Take 5 mg by mouth 3 (three) times daily as needed., Disp: , Rfl:  .  oxyCODONE 30 MG 12 hr tablet, Take 30 mg by mouth 3 (three) times daily., Disp: ,  Rfl:  .  predniSONE (DELTASONE) 10 MG tablet, 1 taper pack x 12 days PRN for COPD exacerbation, Disp: , Rfl: 5 .  traZODone (DESYREL) 50 MG tablet, Take 1 tablet by mouth at bedtime., Disp: , Rfl: 4  Review of Systems  Constitutional: Negative.   Respiratory: Negative.   Cardiovascular: Negative.   Musculoskeletal: Negative.   Psychiatric/Behavioral:       Depression     Social History   Tobacco Use  . Smoking status: Former Smoker    Packs/day: 1.00    Years: 40.00    Pack years: 40.00    Types: Cigarettes    Last attempt to quit: 06/15/2016    Years since quitting: 1.4  . Smokeless tobacco: Never Used  Substance Use Topics  . Alcohol use: Not Currently   Objective:   BP (!) 144/62 (BP Location: Left Arm, Patient Position: Sitting, Cuff Size: Normal)   Pulse 93   Temp 98.6 F (37 C) (Oral)   Wt 88 lb (39.9 kg)   SpO2 97%   BMI 20.09 kg/m  Vitals:   12/06/17 1333  BP: (!) 144/62  Pulse: 93  Temp: 98.6 F (37 C)  TempSrc: Oral  SpO2: 97%  Weight: 88 lb (39.9 kg)     Physical Exam  Constitutional: She is oriented to person, place, and time. No distress.  Small, frail, elderly woman  HENT:  Head: Normocephalic and atraumatic.  Right Ear: Tympanic membrane, external ear and ear canal normal.  Left Ear: Tympanic membrane, external ear and ear canal normal.  Nose: Nose normal.  Mouth/Throat: Oropharynx is clear and moist. No oropharyngeal exudate or posterior oropharyngeal erythema.  Eyes: Pupils are equal, round, and reactive to light. Conjunctivae are normal. No scleral icterus.  Neck: Neck supple. No thyromegaly present.  L submandibular salivary gland enlarged, tender to palpation, mobile, warm  Cardiovascular: Normal rate, regular rhythm and normal heart sounds.  No murmur heard. Pulmonary/Chest: Effort normal. She has wheezes (scattered, end expiratory).  Abdominal: Soft. She exhibits no distension. There is no tenderness.  Musculoskeletal: She exhibits  no edema.  Significant thoracic kyphosis  Lymphadenopathy:    She has no cervical adenopathy.  Neurological: She is alert and oriented to person, place, and time.  Skin: Skin is warm and dry. Capillary refill takes less than 2 seconds. No rash noted.  Psychiatric: She has a normal mood and affect. Her behavior is normal.  Vitals reviewed.       Assessment & Plan:   Problem List Items Addressed This Visit      Cardiovascular and Mediastinum   Hypertension - Primary    Elevated today, but well-controlled at home She does have some degree of whitecoat hypertension Given her age and comorbidities, this is still of an acceptable level Continue current medications at current doses-amlodipine 5 mg daily, atenolol 50 mg daily, diltiazem 300 mg daily She did not  get labs after her last visit as she was supposed to, so we will check her metabolic panel today      Relevant Medications   diltiazem (CARDIZEM CD) 300 MG 24 hr capsule   amLODipine (NORVASC) 5 MG tablet   atenolol (TENORMIN) 50 MG tablet   atorvastatin (LIPITOR) 20 MG tablet   Paroxysmal atrial fibrillation (HCC)    Well-controlled Currently in normal sinus rhythm Continue atenolol and diltiazem, as well as baby aspirin daily      Relevant Medications   diltiazem (CARDIZEM CD) 300 MG 24 hr capsule   amLODipine (NORVASC) 5 MG tablet   atenolol (TENORMIN) 50 MG tablet   atorvastatin (LIPITOR) 20 MG tablet     Respiratory   COPD (chronic obstructive pulmonary disease) (HCC)    Chronic and uncontrolled She is not currently on any controller medications and is using Spiriva as needed She did have a recent exacerbation per report, but did not take any medications for it and is doing better today Given her 3 exacerbations within the last year, it is reasonable that she be on a controller medication We will resume Spiriva daily She can use albuterol as needed She was previously on home oxygen therapy, but is not needing  this and is satting well today Discussed importance of being seen when she feels as though she may have an exacerbation and will not give standing prednisone tapers to take as she feels needed      Relevant Medications   tiotropium (SPIRIVA HANDIHALER) 18 MCG inhalation capsule     Digestive   Sialoadenitis    New problem Tenderness and warmth on exam over salivary gland is likely representative of infection and possible stone Discussed using sour candies to encourage drainage We will treat with 7-day course of doxycycline Discussed return precautions If recurs, may need ENT referral        Genitourinary   Chronic kidney disease, stage 2, mildly decreased GFR    Did not have labs after her last visit Recheck metabolic panel today Avoid nephrotoxic medications        Other   Hyperlipidemia    Previous control is unknown Continue Lipitor 20 mg daily currently She did not get her labs after her last visit, we will recheck CMP and lipid panel today Previous records from PCP were requested, but not received      Relevant Medications   diltiazem (CARDIZEM CD) 300 MG 24 hr capsule   amLODipine (NORVASC) 5 MG tablet   atenolol (TENORMIN) 50 MG tablet   atorvastatin (LIPITOR) 20 MG tablet   Depression, recurrent (HCC)    Stable and well-controlled Continue Remeron and trazodone Continue to monitor      Relevant Medications   mirtazapine (REMERON) 30 MG tablet   traZODone (DESYREL) 50 MG tablet   Failure to thrive in adult    Well-controlled and currently with normal BMI Weight has increased from last visit Appetite seems to be appropriate Continue Remeron at current dose       Other Visit Diagnoses    Need for influenza vaccination       Relevant Orders   Flu vaccine HIGH DOSE PF (Completed)       Return in about 6 months (around 06/06/2018) for chronic disease f/u and AWV.   The entirety of the information documented in the History of Present Illness, Review  of Systems and Physical Exam were personally obtained by me. Portions of this information were initially documented by [facility]  Waldo Laine, CMA and reviewed by me for thoroughness and accuracy.    Virginia Crews, MD, MPH [facility] 12/06/2017 3:19 PM

## 2017-12-06 NOTE — Assessment & Plan Note (Signed)
Stable and well-controlled Continue Remeron and trazodone Continue to monitor

## 2017-12-06 NOTE — Assessment & Plan Note (Signed)
Previous control is unknown Continue Lipitor 20 mg daily currently She did not get her labs after her last visit, we will recheck CMP and lipid panel today Previous records from PCP were requested, but not received

## 2017-12-06 NOTE — Assessment & Plan Note (Signed)
Chronic and uncontrolled She is not currently on any controller medications and is using Spiriva as needed She did have a recent exacerbation per report, but did not take any medications for it and is doing better today Given her 3 exacerbations within the last year, it is reasonable that she be on a controller medication We will resume Spiriva daily She can use albuterol as needed She was previously on home oxygen therapy, but is not needing this and is satting well today Discussed importance of being seen when she feels as though she may have an exacerbation and will not give standing prednisone tapers to take as she feels needed

## 2017-12-06 NOTE — Assessment & Plan Note (Signed)
Well-controlled and currently with normal BMI Weight has increased from last visit Appetite seems to be appropriate Continue Remeron at current dose

## 2017-12-06 NOTE — Assessment & Plan Note (Signed)
New problem Tenderness and warmth on exam over salivary gland is likely representative of infection and possible stone Discussed using sour candies to encourage drainage We will treat with 7-day course of doxycycline Discussed return precautions If recurs, may need ENT referral

## 2017-12-06 NOTE — Assessment & Plan Note (Signed)
Elevated today, but well-controlled at home She does have some degree of whitecoat hypertension Given her age and comorbidities, this is still of an acceptable level Continue current medications at current doses-amlodipine 5 mg daily, atenolol 50 mg daily, diltiazem 300 mg daily She did not get labs after her last visit as she was supposed to, so we will check her metabolic panel today

## 2017-12-06 NOTE — Assessment & Plan Note (Signed)
Well-controlled Currently in normal sinus rhythm Continue atenolol and diltiazem, as well as baby aspirin daily

## 2017-12-06 NOTE — Patient Instructions (Signed)
Salivary Gland Infection  A salivary gland infection is an infection in one or more of the glands that produce spit (saliva). You have six major salivary glands. Each gland has a duct that carries saliva into your mouth. Saliva keeps your mouth moist and breaks down the food that you eat. It also helps to prevent tooth decay.  Two salivary glands are located just in front of your ears (parotid). The ducts for these glands open up inside your cheeks, near your back teeth. You also have two glands under your tongue (sublingual) and two glands under your jaw (submandibular). The ducts for these glands open under your tongue. Any salivary gland can become infected. Most infections occur in the parotid glands or submandibular glands.  What are the causes?  Salivary glands can be infected by viruses or bacteria.  · The mumps virus is the most common cause of viral salivary gland infections, though mumps is now rare in many areas because of vaccination.  ? This infection causes swelling in both parotid glands.  ? Viral infections are more common in children.  · The bacteria that cause salivary gland infections are usually the same bacteria that normally live in your mouth.  ? A stone can form in a salivary gland and block the flow of saliva. As a result, saliva backs up into the salivary gland. Bacteria may then start to grow behind the blockage and cause infection.  ? Bacterial infections usually cause pain and swelling on one side of the face. Submandibular gland swelling occurs under the jaw. Parotid swelling occurs in front of the ear.  ? Bacterial infections are more common in adults.    What increases the risk?  Children who do not get the MMR (measles, mumps, rubella) vaccine are more likely to get mumps, which can cause a viral salivary gland infection.  Risk factors for bacterial infections include:  · Poor dental care (oral hygiene).  · Smoking.  · Not drinking enough water.  · Having a disease that causes dry  mouth and dry eyes (Mikulicz syndrome or Sjogren syndrome).    What are the signs or symptoms?  The main sign of salivary gland infection is a swollen salivary gland. This type of inflammation is often called sialadenitis. You may have swelling in front of your ear, under your jaw, or under your tongue. Swelling may get worse when you eat and decrease after you eat. Other signs and symptoms include:  · Pain.  · Tenderness.  · Redness.  · Dry mouth.  · Bad taste in your mouth.  · Difficulty chewing and swallowing.  · Fever.    How is this diagnosed?  Your health care provider may suspect a salivary gland infection based on your signs and symptoms. He or she will also do a physical exam. The health care provider will look and feel inside your mouth to see whether a stone is blocking a salivary gland duct. You may need to see an ear, nose, and throat specialist (ENT or otolaryngologist) for diagnosis and treatment. You may also need to have diagnostic tests, such as:  · An X-ray to check for a stone.  · Other imaging studies to look for an abscess and to rule out other causes of swelling. These tests may include:  ? Ultrasound.  ? CT scan.  ? MRI.  · Culture and sensitivity test. This involves collecting a sample of pus for testing in the lab to see what bacteria grow and what antibiotics   without treatment. Bacterial infections are usually treated with antibiotic medicine. Severe infections that cause difficulty with swallowing may be treated with an IV antibiotic in the hospital. Other treatments may include:  Probing and widening the salivary duct to allow a stone to pass. In some cases, a thin, flexible scope (endoscope) may be inserted into the duct to find a stone and remove it.  Breaking  up a stone using sound waves.  Draining an infected gland (abscess) with a needle.  In some cases, you may need surgery so your health care provider can: ? Remove a stone. ? Drain pus from an abscess. ? Remove a badly infected gland.  Follow these instructions at home:  Take medicines only as directed by your health care provider.  If you were prescribed an antibiotic medicine, finish it all even if you start to feel better.  Follow these instructions every few hours: ? Suck on a lemon candy to stimulate the flow of saliva. ? Put a warm compress over the gland. ? Gently massage the gland.  Drink enough fluid to keep your urine clear or pale yellow.  Rinse your mouth with a mixture of warm water and salt every few hours. To make this mixture, add a pinch of salt to 1 cup of warm water.  Practice good oral hygiene by brushing and flossing your teeth after meals and before you go to bed.  Do not use any tobacco products, including cigarettes, chewing tobacco, or electronic cigarettes. If you need help quitting, ask your health care provider. Contact a health care provider if:  You have pain and swelling in your face, jaw, or mouth after eating.  You have persistent swelling in any of these places: ? In front of your ear. ? Under your jaw. ? Inside your mouth. Get help right away if:  You have pain and swelling in your face, jaw, or mouth that are getting worse.  Your pain and swelling make it hard to swallow or breathe. This information is not intended to replace advice given to you by your health care provider. Make sure you discuss any questions you have with your health care provider. Document Released: 02/09/2004 Document Revised: 06/09/2015 Document Reviewed: 06/03/2013 Elsevier Interactive Patient Education  2018 [employer].

## 2017-12-07 LAB — LIPID PANEL
Chol/HDL Ratio: 2.5 ratio (ref 0.0–4.4)
Cholesterol, Total: 138 mg/dL (ref 100–199)
HDL: 56 mg/dL (ref >39)
LDL Calculated: 58 mg/dL (ref 0–99)
Triglycerides: 119 mg/dL (ref 0–149)
VLDL Cholesterol Cal: 24 mg/dL (ref 5–40)

## 2017-12-07 LAB — COMPREHENSIVE METABOLIC PANEL
ALBUMIN: 3.4 g/dL — AB (ref 3.5–4.8)
ALT: 10 IU/L (ref 0–32)
AST: 15 IU/L (ref 0–40)
Albumin/Globulin Ratio: 1.1 — ABNORMAL LOW (ref 1.2–2.2)
Alkaline Phosphatase: 117 IU/L (ref 39–117)
BUN / CREAT RATIO: 10 — AB (ref 12–28)
BUN: 11 mg/dL (ref 8–27)
Bilirubin Total: <0.2 mg/dL (ref 0.0–1.2)
CO2: 23 mmol/L (ref 20–29)
CREATININE: 1.14 mg/dL — AB (ref 0.57–1.00)
Calcium: 8.9 mg/dL (ref 8.7–10.3)
Chloride: 101 mmol/L (ref 96–106)
GFR calc non Af Amer: 47 mL/min/{1.73_m2} — ABNORMAL LOW (ref >59)
GFR, EST AFRICAN AMERICAN: 54 mL/min/{1.73_m2} — AB (ref >59)
GLUCOSE: 141 mg/dL — AB (ref 65–99)
Globulin, Total: 3.2 g/dL (ref 1.5–4.5)
Potassium: 4 mmol/L (ref 3.5–5.2)
Sodium: 141 mmol/L (ref 134–144)
TOTAL PROTEIN: 6.6 g/dL (ref 6.0–8.5)

## 2017-12-07 LAB — CBC
HEMATOCRIT: 33.7 % — AB (ref 34.0–46.6)
Hemoglobin: 10.9 g/dL — ABNORMAL LOW (ref 11.1–15.9)
MCH: 27.2 pg (ref 26.6–33.0)
MCHC: 32.3 g/dL (ref 31.5–35.7)
MCV: 84 fL (ref 79–97)
Platelets: 252 10*3/uL (ref 150–450)
RBC: 4.01 x10E6/uL (ref 3.77–5.28)
RDW: 13.5 % (ref 12.3–15.4)
WBC: 17.5 10*3/uL — AB (ref 3.4–10.8)

## 2017-12-09 ENCOUNTER — Telehealth: Payer: Self-pay

## 2017-12-09 NOTE — Telephone Encounter (Signed)
lmtcb

## 2017-12-09 NOTE — Telephone Encounter (Signed)
-----   Message from Carmon Ginsberg, [place] sent at 12/09/2017  8:54 AM EST ----- Labs are ok except sugar is elevated. Follow up with Dr. Jacinto Reap. In two weeks.

## 2017-12-16 NOTE — Telephone Encounter (Signed)
No that's ok.  We'll keep our f/u as scheduled  Carlyne Keehan, Dionne Bucy, MD, MPH [facility] 12/16/2017 10:27 AM

## 2017-12-16 NOTE — Telephone Encounter (Signed)
Advised caregiver to keep fup as scheduled.  dbs

## 2017-12-16 NOTE — Telephone Encounter (Signed)
Pt's caregiver is stating that pt had eaten a muffin and dranked some apple juice right before appt and states that you were aware of this.  Does pt still need to have a 2 week f-up appt?  dbs

## 2018-02-11 DIAGNOSIS — M48061 Spinal stenosis, lumbar region without neurogenic claudication: Secondary | ICD-10-CM | POA: Diagnosis not present

## 2018-02-11 DIAGNOSIS — G894 Chronic pain syndrome: Secondary | ICD-10-CM | POA: Diagnosis not present

## 2018-02-11 DIAGNOSIS — Z79899 Other long term (current) drug therapy: Secondary | ICD-10-CM | POA: Diagnosis not present

## 2018-02-11 DIAGNOSIS — Z79891 Long term (current) use of opiate analgesic: Secondary | ICD-10-CM | POA: Diagnosis not present

## 2018-02-11 DIAGNOSIS — Z5181 Encounter for therapeutic drug level monitoring: Secondary | ICD-10-CM | POA: Diagnosis not present

## 2018-02-11 DIAGNOSIS — M5126 Other intervertebral disc displacement, lumbar region: Secondary | ICD-10-CM | POA: Diagnosis not present

## 2018-04-08 DIAGNOSIS — M48061 Spinal stenosis, lumbar region without neurogenic claudication: Secondary | ICD-10-CM | POA: Diagnosis not present

## 2018-04-08 DIAGNOSIS — M5126 Other intervertebral disc displacement, lumbar region: Secondary | ICD-10-CM | POA: Diagnosis not present

## 2018-04-08 DIAGNOSIS — G894 Chronic pain syndrome: Secondary | ICD-10-CM | POA: Diagnosis not present

## 2018-06-06 ENCOUNTER — Ambulatory Visit: Payer: Self-pay | Admitting: Family Medicine

## 2018-06-25 ENCOUNTER — Encounter: Payer: Medicare Other | Admitting: Family Medicine

## 2018-06-26 ENCOUNTER — Ambulatory Visit: Payer: Self-pay | Admitting: Family Medicine

## 2018-06-26 NOTE — Progress Notes (Signed)
Patient not available at time of appt for evisit and not able to be reached by telephone. No show.   This encounter was created in error - please disregard.

## 2018-07-21 ENCOUNTER — Ambulatory Visit: Payer: Self-pay | Admitting: Family Medicine

## 2018-08-08 DIAGNOSIS — M5126 Other intervertebral disc displacement, lumbar region: Secondary | ICD-10-CM | POA: Diagnosis not present

## 2018-08-08 DIAGNOSIS — G894 Chronic pain syndrome: Secondary | ICD-10-CM | POA: Diagnosis not present

## 2018-08-08 DIAGNOSIS — M48061 Spinal stenosis, lumbar region without neurogenic claudication: Secondary | ICD-10-CM | POA: Diagnosis not present

## 2018-09-03 ENCOUNTER — Other Ambulatory Visit: Payer: Self-pay | Admitting: Family Medicine

## 2018-10-22 DIAGNOSIS — M48061 Spinal stenosis, lumbar region without neurogenic claudication: Secondary | ICD-10-CM | POA: Diagnosis not present

## 2018-10-22 DIAGNOSIS — M5126 Other intervertebral disc displacement, lumbar region: Secondary | ICD-10-CM | POA: Diagnosis not present

## 2018-10-22 DIAGNOSIS — G894 Chronic pain syndrome: Secondary | ICD-10-CM | POA: Diagnosis not present

## 2018-11-21 ENCOUNTER — Ambulatory Visit: Payer: Self-pay | Admitting: Family Medicine

## 2018-11-21 ENCOUNTER — Encounter: Payer: Self-pay | Admitting: Family Medicine

## 2018-11-21 NOTE — Progress Notes (Deleted)
{Method of visit:23308}  Patient: Caitlyn Wang Female    DOB: 1940/08/10   78 y.o.   MRN: [id number] Visit Date: 11/21/2018  Today's Provider: Lavon Paganini, MD   No chief complaint on file.  Subjective:     HPI  Hypertension, follow-up:  BP Readings from Last 3 Encounters:  12/06/17 (!) 144/62  08/22/17 (!) 148/70  05/17/14 (!) 151/68    She was last seen for hypertension 1 years ago.  BP at that visit was 144/62. Management changes since that visit include no changes. She reports {excellent/good/fair/poor:19665} compliance with treatment. She {ACTION; IS/IS VG:[id number] having side effects. *** She {is/is not:9024} exercising. She {is/is not:9024} adherent to low salt diet.   Outside blood pressures are ***. She is experiencing {Symptoms; cardiac:12860}.  Patient denies {Symptoms; cardiac:12860}.   Cardiovascular risk factors include {cv risk factors:510}.  Use of agents associated with hypertension: {bp agents assoc with hypertension:511::"none"}.     Weight trend: {trend:16658} Wt Readings from Last 3 Encounters:  12/06/17 88 lb (39.9 kg)  08/22/17 86 lb (39 kg)  05/17/14 122 lb 2.2 oz (55.4 kg)   Current diet: {diet habits:16563} ------------------------------------------------------------------------  Lipid/Cholesterol, Follow-up:   Last seen for this{1-12:18279} {days/wks/mos/yrs:310907} ago.  Management changes since that visit include ***. Marland Kitchen Last Lipid Panel:    Component Value Date/Time   CHOL 138 12/06/2017 1426   CHOL 101 05/09/2014 0827   TRIG 119 12/06/2017 1426   TRIG 80 05/09/2014 0827   HDL 56 12/06/2017 1426   HDL 41 05/09/2014 0827   CHOLHDL 2.5 12/06/2017 1426   VLDL 16 05/09/2014 0827   LDLCALC 58 12/06/2017 1426   LDLCALC 44 05/09/2014 0827   Risk factors for vascular disease include {risk factors atherosclerosis:10337}  She reports {excellent/good/fair/poor:19665} compliance with treatment. She {ACTION; IS/IS  VG:[id number] having side effects.  Current symptoms include {Symptoms; diabetes:14075} and have been {Desc; course:15616}. Weight trend: {trend:16658} Prior visit with dietician: {yes/no:17258} Current diet: {diet habits:16563} Current exercise: {exercise types:16438}  Wt Readings from Last 3 Encounters:  12/06/17 88 lb (39.9 kg)  08/22/17 86 lb (39 kg)  05/17/14 122 lb 2.2 oz (55.4 kg)   -------------------------------------------------------------------  Depression, Follow-up  She  was last seen for this {NUMBERS 1-12:18279} {days/wks/mos/yrs:310907} ago. Changes made at last visit include ***.   She reports {excellent/good/fair/poor:19665} compliance with treatment. She {ACTION; IS/IS VG:[id number] having side effects. ***  She reports {DESC; GOOD/FAIR/POOR:18685} tolerance of treatment. Current symptoms include: {Symptoms; depression:1002} She feels she is {improved/worse/unchanged:3041574} since last visit.  ------------------------------------------------------------------------  Allergies  Allergen Reactions  . Bupropion Nausea Only  . Penicillins      Current Outpatient Medications:  .  amLODipine (NORVASC) 5 MG tablet, Take 1 tablet (5 mg total) by mouth daily., Disp: 90 tablet, Rfl: 3 .  aspirin 81 MG tablet, Take 81 mg by mouth daily., Disp: , Rfl:  .  atenolol (TENORMIN) 50 MG tablet, Take 1 tablet (50 mg total) by mouth daily., Disp: 90 tablet, Rfl: 3 .  atorvastatin (LIPITOR) 20 MG tablet, Take 1 tablet (20 mg total) by mouth daily., Disp: 90 tablet, Rfl: 3 .  diltiazem (CARDIZEM CD) 300 MG 24 hr capsule, TAKE 1 CAPSULE(300 MG) BY MOUTH DAILY, Disp: 90 capsule, Rfl: 0 .  folic acid (FOLVITE) 1 MG tablet, Take 1 tablet (1 mg total) by mouth daily., Disp: 90 tablet, Rfl: 3 .  mirtazapine (REMERON) 30 MG tablet, Take 1 tablet (30 mg total) by mouth daily., Disp: 90 tablet, Rfl: 3 .  oxycodone (OXY-IR) 5 MG capsule, Take 5 mg by mouth 3 (three) times daily as  needed., Disp: , Rfl:  .  oxyCODONE 30 MG 12 hr tablet, Take 30 mg by mouth 3 (three) times daily., Disp: , Rfl:  .  predniSONE (DELTASONE) 10 MG tablet, 1 taper pack x 12 days PRN for COPD exacerbation, Disp: , Rfl: 5 .  tiotropium (SPIRIVA HANDIHALER) 18 MCG inhalation capsule, Place 1 capsule (18 mcg total) into inhaler and inhale daily., Disp: 90 capsule, Rfl: 3 .  traZODone (DESYREL) 50 MG tablet, Take 1 tablet (50 mg total) by mouth at bedtime., Disp: 90 tablet, Rfl: 3  Review of Systems  Constitutional: Negative.   Respiratory: Negative.   Cardiovascular: Negative.     Social History   Tobacco Use  . Smoking status: Former Smoker    Packs/day: 1.00    Years: 40.00    Pack years: 40.00    Types: Cigarettes    Quit date: 06/15/2016    Years since quitting: 2.4  . Smokeless tobacco: Never Used  Substance Use Topics  . Alcohol use: Not Currently      Objective:   There were no vitals taken for this visit. There were no vitals filed for this visit.There is no height or weight on file to calculate BMI.   Physical Exam   No results found for any visits on 11/21/18.     Assessment & Plan        Lavon Paganini, MD  [facility]

## 2018-11-26 ENCOUNTER — Telehealth: Payer: Self-pay

## 2018-11-26 NOTE — Telephone Encounter (Signed)
Synetta Shadow, POA for the patient called to make an appointment for the patient.  Dr B asked that I tell them we would call them back about this.  The patient was scheduled on Friday and did not keep the appointment.  There was a letter from Friday releasing the patient.    Her number is [phone]

## 2018-12-01 ENCOUNTER — Other Ambulatory Visit: Payer: Self-pay | Admitting: Family Medicine

## 2018-12-01 DIAGNOSIS — G629 Polyneuropathy, unspecified: Secondary | ICD-10-CM | POA: Diagnosis present

## 2018-12-01 DIAGNOSIS — Z862 Personal history of diseases of the blood and blood-forming organs and certain disorders involving the immune mechanism: Secondary | ICD-10-CM | POA: Diagnosis not present

## 2018-12-01 DIAGNOSIS — Y929 Unspecified place or not applicable: Secondary | ICD-10-CM | POA: Diagnosis not present

## 2018-12-01 DIAGNOSIS — Z87891 Personal history of nicotine dependence: Secondary | ICD-10-CM | POA: Diagnosis not present

## 2018-12-01 DIAGNOSIS — S32511D Fracture of superior rim of right pubis, subsequent encounter for fracture with routine healing: Secondary | ICD-10-CM | POA: Diagnosis not present

## 2018-12-01 DIAGNOSIS — S72142A Displaced intertrochanteric fracture of left femur, initial encounter for closed fracture: Secondary | ICD-10-CM | POA: Diagnosis not present

## 2018-12-01 DIAGNOSIS — E46 Unspecified protein-calorie malnutrition: Secondary | ICD-10-CM | POA: Diagnosis not present

## 2018-12-01 DIAGNOSIS — I129 Hypertensive chronic kidney disease with stage 1 through stage 4 chronic kidney disease, or unspecified chronic kidney disease: Secondary | ICD-10-CM | POA: Diagnosis present

## 2018-12-01 DIAGNOSIS — F0390 Unspecified dementia without behavioral disturbance: Secondary | ICD-10-CM | POA: Diagnosis not present

## 2018-12-01 DIAGNOSIS — Z20828 Contact with and (suspected) exposure to other viral communicable diseases: Secondary | ICD-10-CM | POA: Diagnosis not present

## 2018-12-01 DIAGNOSIS — D649 Anemia, unspecified: Secondary | ICD-10-CM | POA: Diagnosis not present

## 2018-12-01 DIAGNOSIS — G8929 Other chronic pain: Secondary | ICD-10-CM | POA: Diagnosis not present

## 2018-12-01 DIAGNOSIS — F39 Unspecified mood [affective] disorder: Secondary | ICD-10-CM | POA: Diagnosis not present

## 2018-12-01 DIAGNOSIS — M6281 Muscle weakness (generalized): Secondary | ICD-10-CM | POA: Diagnosis not present

## 2018-12-01 DIAGNOSIS — R279 Unspecified lack of coordination: Secondary | ICD-10-CM | POA: Diagnosis not present

## 2018-12-01 DIAGNOSIS — S72002A Fracture of unspecified part of neck of left femur, initial encounter for closed fracture: Secondary | ICD-10-CM | POA: Diagnosis not present

## 2018-12-01 DIAGNOSIS — Z23 Encounter for immunization: Secondary | ICD-10-CM | POA: Diagnosis not present

## 2018-12-01 DIAGNOSIS — I4510 Unspecified right bundle-branch block: Secondary | ICD-10-CM | POA: Diagnosis not present

## 2018-12-01 DIAGNOSIS — E0590 Thyrotoxicosis, unspecified without thyrotoxic crisis or storm: Secondary | ICD-10-CM | POA: Diagnosis present

## 2018-12-01 DIAGNOSIS — S79921A Unspecified injury of right thigh, initial encounter: Secondary | ICD-10-CM | POA: Diagnosis not present

## 2018-12-01 DIAGNOSIS — E785 Hyperlipidemia, unspecified: Secondary | ICD-10-CM | POA: Diagnosis present

## 2018-12-01 DIAGNOSIS — I10 Essential (primary) hypertension: Secondary | ICD-10-CM | POA: Diagnosis not present

## 2018-12-01 DIAGNOSIS — M8580 Other specified disorders of bone density and structure, unspecified site: Secondary | ICD-10-CM | POA: Diagnosis present

## 2018-12-01 DIAGNOSIS — Z85038 Personal history of other malignant neoplasm of large intestine: Secondary | ICD-10-CM | POA: Diagnosis not present

## 2018-12-01 DIAGNOSIS — D62 Acute posthemorrhagic anemia: Secondary | ICD-10-CM | POA: Diagnosis not present

## 2018-12-01 DIAGNOSIS — R52 Pain, unspecified: Secondary | ICD-10-CM | POA: Diagnosis not present

## 2018-12-01 DIAGNOSIS — R55 Syncope and collapse: Secondary | ICD-10-CM | POA: Diagnosis not present

## 2018-12-01 DIAGNOSIS — J449 Chronic obstructive pulmonary disease, unspecified: Secondary | ICD-10-CM | POA: Diagnosis present

## 2018-12-01 DIAGNOSIS — I480 Paroxysmal atrial fibrillation: Secondary | ICD-10-CM | POA: Diagnosis present

## 2018-12-01 DIAGNOSIS — S3993XA Unspecified injury of pelvis, initial encounter: Secondary | ICD-10-CM | POA: Diagnosis not present

## 2018-12-01 DIAGNOSIS — S72142D Displaced intertrochanteric fracture of left femur, subsequent encounter for closed fracture with routine healing: Secondary | ICD-10-CM | POA: Diagnosis not present

## 2018-12-01 DIAGNOSIS — W19XXXA Unspecified fall, initial encounter: Secondary | ICD-10-CM | POA: Diagnosis not present

## 2018-12-01 DIAGNOSIS — D539 Nutritional anemia, unspecified: Secondary | ICD-10-CM | POA: Diagnosis not present

## 2018-12-01 DIAGNOSIS — F329 Major depressive disorder, single episode, unspecified: Secondary | ICD-10-CM | POA: Diagnosis present

## 2018-12-01 DIAGNOSIS — N182 Chronic kidney disease, stage 2 (mild): Secondary | ICD-10-CM | POA: Diagnosis present

## 2018-12-01 DIAGNOSIS — Z880 Allergy status to penicillin: Secondary | ICD-10-CM | POA: Diagnosis not present

## 2018-12-01 DIAGNOSIS — M810 Age-related osteoporosis without current pathological fracture: Secondary | ICD-10-CM | POA: Diagnosis not present

## 2018-12-01 DIAGNOSIS — M80052A Age-related osteoporosis with current pathological fracture, left femur, initial encounter for fracture: Secondary | ICD-10-CM | POA: Diagnosis present

## 2018-12-01 DIAGNOSIS — S32591D Other specified fracture of right pubis, subsequent encounter for fracture with routine healing: Secondary | ICD-10-CM | POA: Diagnosis not present

## 2018-12-01 DIAGNOSIS — Z9221 Personal history of antineoplastic chemotherapy: Secondary | ICD-10-CM | POA: Diagnosis not present

## 2018-12-01 DIAGNOSIS — W19XXXD Unspecified fall, subsequent encounter: Secondary | ICD-10-CM | POA: Diagnosis not present

## 2018-12-01 DIAGNOSIS — R5381 Other malaise: Secondary | ICD-10-CM | POA: Diagnosis not present

## 2018-12-01 MED ORDER — ATENOLOL 50 MG PO TABS: 50.0000 mg | ORAL_TABLET | Freq: Every day | ORAL | 0 refills | Status: AC

## 2018-12-01 MED ORDER — DILTIAZEM HCL ER COATED BEADS 300 MG PO CP24: 300.0000 mg | ORAL_CAPSULE | Freq: Every day | ORAL | 0 refills | Status: AC

## 2018-12-01 MED ORDER — FOLIC ACID 1 MG PO TABS: 1.0000 mg | ORAL_TABLET | Freq: Every day | ORAL | 0 refills | Status: AC

## 2018-12-01 MED ORDER — AMLODIPINE BESYLATE 5 MG PO TABS: 5.0000 mg | ORAL_TABLET | Freq: Every day | ORAL | 0 refills | Status: AC

## 2018-12-01 MED ORDER — ATORVASTATIN CALCIUM 20 MG PO TABS: 20.0000 mg | ORAL_TABLET | Freq: Every day | ORAL | 0 refills | Status: AC

## 2018-12-02 ENCOUNTER — Telehealth: Payer: Self-pay | Admitting: Family Medicine

## 2018-12-02 NOTE — Telephone Encounter (Signed)
[place] faxed refill request for the following medications:   atenolol (TENORMIN) 50 MG tablet   Please advise.  Thanks, [employer]

## 2018-12-02 NOTE — Telephone Encounter (Signed)
[place] faxed refill request for the following medications:  diltiazem (CARDIZEM CD) 300 MG 24 hr capsule - 90 day amLODipine (NORVASC) 5 MG tablet - 90 day  folic acid (FOLVITE) 1 MG tablet - 90 day   Please advise.  Thanks, [employer]

## 2018-12-02 NOTE — Telephone Encounter (Signed)
[place] faxed refill request for the following medications:  atorvastatin (LIPITOR) 20 MG tablet - 90 day supply   Please advise.

## 2018-12-03 ENCOUNTER — Ambulatory Visit: Payer: Medicare Other | Admitting: Family Medicine

## 2018-12-03 NOTE — Telephone Encounter (Signed)
We agreed to 30 day supply to give her enough to see new PCP

## 2018-12-03 NOTE — Telephone Encounter (Signed)
Please review.  Has this pt been dismissed from the practice?  Thanks,   -Mickel Baas

## 2018-12-03 NOTE — Telephone Encounter (Signed)
Yes. She has been dismissed.  We agreed to 30 day supply of all medications to give her time to find new PCP

## 2018-12-08 DIAGNOSIS — N182 Chronic kidney disease, stage 2 (mild): Secondary | ICD-10-CM | POA: Diagnosis not present

## 2018-12-08 DIAGNOSIS — M6281 Muscle weakness (generalized): Secondary | ICD-10-CM | POA: Diagnosis not present

## 2018-12-08 DIAGNOSIS — E46 Unspecified protein-calorie malnutrition: Secondary | ICD-10-CM | POA: Diagnosis not present

## 2018-12-08 DIAGNOSIS — G629 Polyneuropathy, unspecified: Secondary | ICD-10-CM | POA: Diagnosis not present

## 2018-12-08 DIAGNOSIS — F329 Major depressive disorder, single episode, unspecified: Secondary | ICD-10-CM | POA: Diagnosis not present

## 2018-12-08 DIAGNOSIS — W19XXXD Unspecified fall, subsequent encounter: Secondary | ICD-10-CM | POA: Diagnosis not present

## 2018-12-08 DIAGNOSIS — G8929 Other chronic pain: Secondary | ICD-10-CM | POA: Diagnosis not present

## 2018-12-08 DIAGNOSIS — M810 Age-related osteoporosis without current pathological fracture: Secondary | ICD-10-CM | POA: Diagnosis not present

## 2018-12-08 DIAGNOSIS — S32591D Other specified fracture of right pubis, subsequent encounter for fracture with routine healing: Secondary | ICD-10-CM | POA: Diagnosis not present

## 2018-12-08 DIAGNOSIS — I480 Paroxysmal atrial fibrillation: Secondary | ICD-10-CM | POA: Diagnosis not present

## 2018-12-08 DIAGNOSIS — R5381 Other malaise: Secondary | ICD-10-CM | POA: Diagnosis not present

## 2018-12-08 DIAGNOSIS — Z862 Personal history of diseases of the blood and blood-forming organs and certain disorders involving the immune mechanism: Secondary | ICD-10-CM | POA: Diagnosis not present

## 2018-12-08 DIAGNOSIS — M8580 Other specified disorders of bone density and structure, unspecified site: Secondary | ICD-10-CM | POA: Diagnosis not present

## 2018-12-08 DIAGNOSIS — M8000XD Age-related osteoporosis with current pathological fracture, unspecified site, subsequent encounter for fracture with routine healing: Secondary | ICD-10-CM | POA: Diagnosis not present

## 2018-12-08 DIAGNOSIS — S72142A Displaced intertrochanteric fracture of left femur, initial encounter for closed fracture: Secondary | ICD-10-CM | POA: Diagnosis not present

## 2018-12-08 DIAGNOSIS — I10 Essential (primary) hypertension: Secondary | ICD-10-CM | POA: Diagnosis not present

## 2018-12-08 DIAGNOSIS — Z85038 Personal history of other malignant neoplasm of large intestine: Secondary | ICD-10-CM | POA: Diagnosis not present

## 2018-12-08 DIAGNOSIS — E785 Hyperlipidemia, unspecified: Secondary | ICD-10-CM | POA: Diagnosis not present

## 2018-12-08 DIAGNOSIS — Z87891 Personal history of nicotine dependence: Secondary | ICD-10-CM | POA: Diagnosis not present

## 2018-12-08 DIAGNOSIS — S32511D Fracture of superior rim of right pubis, subsequent encounter for fracture with routine healing: Secondary | ICD-10-CM | POA: Diagnosis not present

## 2018-12-08 DIAGNOSIS — J449 Chronic obstructive pulmonary disease, unspecified: Secondary | ICD-10-CM | POA: Diagnosis not present

## 2018-12-08 DIAGNOSIS — J410 Simple chronic bronchitis: Secondary | ICD-10-CM | POA: Diagnosis not present

## 2018-12-08 DIAGNOSIS — F0390 Unspecified dementia without behavioral disturbance: Secondary | ICD-10-CM | POA: Diagnosis not present

## 2018-12-08 DIAGNOSIS — I129 Hypertensive chronic kidney disease with stage 1 through stage 4 chronic kidney disease, or unspecified chronic kidney disease: Secondary | ICD-10-CM | POA: Diagnosis not present

## 2018-12-08 DIAGNOSIS — D649 Anemia, unspecified: Secondary | ICD-10-CM | POA: Diagnosis not present

## 2018-12-08 DIAGNOSIS — S72142D Displaced intertrochanteric fracture of left femur, subsequent encounter for closed fracture with routine healing: Secondary | ICD-10-CM | POA: Diagnosis not present

## 2018-12-08 DIAGNOSIS — G894 Chronic pain syndrome: Secondary | ICD-10-CM | POA: Diagnosis not present

## 2018-12-08 DIAGNOSIS — R279 Unspecified lack of coordination: Secondary | ICD-10-CM | POA: Diagnosis not present

## 2018-12-09 DIAGNOSIS — G894 Chronic pain syndrome: Secondary | ICD-10-CM | POA: Diagnosis not present

## 2018-12-09 DIAGNOSIS — I480 Paroxysmal atrial fibrillation: Secondary | ICD-10-CM | POA: Diagnosis not present

## 2018-12-09 DIAGNOSIS — I10 Essential (primary) hypertension: Secondary | ICD-10-CM | POA: Diagnosis not present

## 2018-12-09 DIAGNOSIS — J410 Simple chronic bronchitis: Secondary | ICD-10-CM | POA: Diagnosis not present

## 2018-12-09 DIAGNOSIS — F0390 Unspecified dementia without behavioral disturbance: Secondary | ICD-10-CM | POA: Diagnosis not present

## 2018-12-09 DIAGNOSIS — M8000XD Age-related osteoporosis with current pathological fracture, unspecified site, subsequent encounter for fracture with routine healing: Secondary | ICD-10-CM | POA: Diagnosis not present

## 2018-12-25 ENCOUNTER — Other Ambulatory Visit: Payer: Self-pay | Admitting: Family Medicine

## 2018-12-31 DIAGNOSIS — G629 Polyneuropathy, unspecified: Secondary | ICD-10-CM | POA: Diagnosis not present

## 2018-12-31 DIAGNOSIS — I129 Hypertensive chronic kidney disease with stage 1 through stage 4 chronic kidney disease, or unspecified chronic kidney disease: Secondary | ICD-10-CM | POA: Diagnosis not present

## 2018-12-31 DIAGNOSIS — W19XXXD Unspecified fall, subsequent encounter: Secondary | ICD-10-CM | POA: Diagnosis not present

## 2018-12-31 DIAGNOSIS — E46 Unspecified protein-calorie malnutrition: Secondary | ICD-10-CM | POA: Diagnosis not present

## 2018-12-31 DIAGNOSIS — S32511D Fracture of superior rim of right pubis, subsequent encounter for fracture with routine healing: Secondary | ICD-10-CM | POA: Diagnosis not present

## 2018-12-31 DIAGNOSIS — N189 Chronic kidney disease, unspecified: Secondary | ICD-10-CM | POA: Diagnosis not present

## 2018-12-31 DIAGNOSIS — F329 Major depressive disorder, single episode, unspecified: Secondary | ICD-10-CM | POA: Diagnosis not present

## 2018-12-31 DIAGNOSIS — F0390 Unspecified dementia without behavioral disturbance: Secondary | ICD-10-CM | POA: Diagnosis not present

## 2018-12-31 DIAGNOSIS — K5900 Constipation, unspecified: Secondary | ICD-10-CM | POA: Diagnosis not present

## 2018-12-31 DIAGNOSIS — I480 Paroxysmal atrial fibrillation: Secondary | ICD-10-CM | POA: Diagnosis not present

## 2018-12-31 DIAGNOSIS — Z9181 History of falling: Secondary | ICD-10-CM | POA: Diagnosis not present

## 2018-12-31 DIAGNOSIS — J449 Chronic obstructive pulmonary disease, unspecified: Secondary | ICD-10-CM | POA: Diagnosis not present

## 2018-12-31 DIAGNOSIS — S72142D Displaced intertrochanteric fracture of left femur, subsequent encounter for closed fracture with routine healing: Secondary | ICD-10-CM | POA: Diagnosis not present

## 2019-01-01 DIAGNOSIS — S32511D Fracture of superior rim of right pubis, subsequent encounter for fracture with routine healing: Secondary | ICD-10-CM | POA: Diagnosis not present

## 2019-01-01 DIAGNOSIS — S72142D Displaced intertrochanteric fracture of left femur, subsequent encounter for closed fracture with routine healing: Secondary | ICD-10-CM | POA: Diagnosis not present

## 2019-01-01 DIAGNOSIS — I480 Paroxysmal atrial fibrillation: Secondary | ICD-10-CM | POA: Diagnosis not present

## 2019-01-01 DIAGNOSIS — F0390 Unspecified dementia without behavioral disturbance: Secondary | ICD-10-CM | POA: Diagnosis not present

## 2019-01-01 DIAGNOSIS — J449 Chronic obstructive pulmonary disease, unspecified: Secondary | ICD-10-CM | POA: Diagnosis not present

## 2019-01-01 DIAGNOSIS — E46 Unspecified protein-calorie malnutrition: Secondary | ICD-10-CM | POA: Diagnosis not present

## 2019-01-02 DIAGNOSIS — S72002S Fracture of unspecified part of neck of left femur, sequela: Secondary | ICD-10-CM | POA: Diagnosis not present

## 2019-01-04 DIAGNOSIS — S32511D Fracture of superior rim of right pubis, subsequent encounter for fracture with routine healing: Secondary | ICD-10-CM | POA: Diagnosis not present

## 2019-01-05 DIAGNOSIS — I480 Paroxysmal atrial fibrillation: Secondary | ICD-10-CM | POA: Diagnosis not present

## 2019-01-05 DIAGNOSIS — F0390 Unspecified dementia without behavioral disturbance: Secondary | ICD-10-CM | POA: Diagnosis not present

## 2019-01-05 DIAGNOSIS — E46 Unspecified protein-calorie malnutrition: Secondary | ICD-10-CM | POA: Diagnosis not present

## 2019-01-05 DIAGNOSIS — J449 Chronic obstructive pulmonary disease, unspecified: Secondary | ICD-10-CM | POA: Diagnosis not present

## 2019-01-05 DIAGNOSIS — S32511D Fracture of superior rim of right pubis, subsequent encounter for fracture with routine healing: Secondary | ICD-10-CM | POA: Diagnosis not present

## 2019-01-05 DIAGNOSIS — S72142D Displaced intertrochanteric fracture of left femur, subsequent encounter for closed fracture with routine healing: Secondary | ICD-10-CM | POA: Diagnosis not present

## 2019-01-06 DIAGNOSIS — S72002S Fracture of unspecified part of neck of left femur, sequela: Secondary | ICD-10-CM | POA: Diagnosis not present

## 2019-01-07 DIAGNOSIS — E46 Unspecified protein-calorie malnutrition: Secondary | ICD-10-CM | POA: Diagnosis not present

## 2019-01-07 DIAGNOSIS — S32511D Fracture of superior rim of right pubis, subsequent encounter for fracture with routine healing: Secondary | ICD-10-CM | POA: Diagnosis not present

## 2019-01-07 DIAGNOSIS — S72142D Displaced intertrochanteric fracture of left femur, subsequent encounter for closed fracture with routine healing: Secondary | ICD-10-CM | POA: Diagnosis not present

## 2019-01-07 DIAGNOSIS — I480 Paroxysmal atrial fibrillation: Secondary | ICD-10-CM | POA: Diagnosis not present

## 2019-01-07 DIAGNOSIS — J449 Chronic obstructive pulmonary disease, unspecified: Secondary | ICD-10-CM | POA: Diagnosis not present

## 2019-01-07 DIAGNOSIS — F0390 Unspecified dementia without behavioral disturbance: Secondary | ICD-10-CM | POA: Diagnosis not present

## 2019-01-12 DIAGNOSIS — I480 Paroxysmal atrial fibrillation: Secondary | ICD-10-CM | POA: Diagnosis not present

## 2019-01-12 DIAGNOSIS — J449 Chronic obstructive pulmonary disease, unspecified: Secondary | ICD-10-CM | POA: Diagnosis not present

## 2019-01-12 DIAGNOSIS — F0390 Unspecified dementia without behavioral disturbance: Secondary | ICD-10-CM | POA: Diagnosis not present

## 2019-01-12 DIAGNOSIS — S32511D Fracture of superior rim of right pubis, subsequent encounter for fracture with routine healing: Secondary | ICD-10-CM | POA: Diagnosis not present

## 2019-01-12 DIAGNOSIS — S72142D Displaced intertrochanteric fracture of left femur, subsequent encounter for closed fracture with routine healing: Secondary | ICD-10-CM | POA: Diagnosis not present

## 2019-01-12 DIAGNOSIS — E46 Unspecified protein-calorie malnutrition: Secondary | ICD-10-CM | POA: Diagnosis not present

## 2019-01-14 DIAGNOSIS — J449 Chronic obstructive pulmonary disease, unspecified: Secondary | ICD-10-CM | POA: Diagnosis not present

## 2019-01-14 DIAGNOSIS — E46 Unspecified protein-calorie malnutrition: Secondary | ICD-10-CM | POA: Diagnosis not present

## 2019-01-14 DIAGNOSIS — I480 Paroxysmal atrial fibrillation: Secondary | ICD-10-CM | POA: Diagnosis not present

## 2019-01-14 DIAGNOSIS — S32511D Fracture of superior rim of right pubis, subsequent encounter for fracture with routine healing: Secondary | ICD-10-CM | POA: Diagnosis not present

## 2019-01-14 DIAGNOSIS — F0390 Unspecified dementia without behavioral disturbance: Secondary | ICD-10-CM | POA: Diagnosis not present

## 2019-01-14 DIAGNOSIS — S72142D Displaced intertrochanteric fracture of left femur, subsequent encounter for closed fracture with routine healing: Secondary | ICD-10-CM | POA: Diagnosis not present

## 2019-01-19 DIAGNOSIS — E46 Unspecified protein-calorie malnutrition: Secondary | ICD-10-CM | POA: Diagnosis not present

## 2019-01-19 DIAGNOSIS — F0390 Unspecified dementia without behavioral disturbance: Secondary | ICD-10-CM | POA: Diagnosis not present

## 2019-01-19 DIAGNOSIS — J449 Chronic obstructive pulmonary disease, unspecified: Secondary | ICD-10-CM | POA: Diagnosis not present

## 2019-01-19 DIAGNOSIS — S32511D Fracture of superior rim of right pubis, subsequent encounter for fracture with routine healing: Secondary | ICD-10-CM | POA: Diagnosis not present

## 2019-01-19 DIAGNOSIS — I480 Paroxysmal atrial fibrillation: Secondary | ICD-10-CM | POA: Diagnosis not present

## 2019-01-19 DIAGNOSIS — S72142D Displaced intertrochanteric fracture of left femur, subsequent encounter for closed fracture with routine healing: Secondary | ICD-10-CM | POA: Diagnosis not present

## 2019-01-21 DIAGNOSIS — M48061 Spinal stenosis, lumbar region without neurogenic claudication: Secondary | ICD-10-CM | POA: Diagnosis not present

## 2019-01-21 DIAGNOSIS — S72142D Displaced intertrochanteric fracture of left femur, subsequent encounter for closed fracture with routine healing: Secondary | ICD-10-CM | POA: Diagnosis not present

## 2019-01-21 DIAGNOSIS — F0390 Unspecified dementia without behavioral disturbance: Secondary | ICD-10-CM | POA: Diagnosis not present

## 2019-01-21 DIAGNOSIS — J449 Chronic obstructive pulmonary disease, unspecified: Secondary | ICD-10-CM | POA: Diagnosis not present

## 2019-01-21 DIAGNOSIS — S32511D Fracture of superior rim of right pubis, subsequent encounter for fracture with routine healing: Secondary | ICD-10-CM | POA: Diagnosis not present

## 2019-01-21 DIAGNOSIS — M5126 Other intervertebral disc displacement, lumbar region: Secondary | ICD-10-CM | POA: Diagnosis not present

## 2019-01-21 DIAGNOSIS — E46 Unspecified protein-calorie malnutrition: Secondary | ICD-10-CM | POA: Diagnosis not present

## 2019-01-21 DIAGNOSIS — I480 Paroxysmal atrial fibrillation: Secondary | ICD-10-CM | POA: Diagnosis not present

## 2019-01-26 DIAGNOSIS — S32511D Fracture of superior rim of right pubis, subsequent encounter for fracture with routine healing: Secondary | ICD-10-CM | POA: Diagnosis not present

## 2019-01-26 DIAGNOSIS — E46 Unspecified protein-calorie malnutrition: Secondary | ICD-10-CM | POA: Diagnosis not present

## 2019-01-26 DIAGNOSIS — I480 Paroxysmal atrial fibrillation: Secondary | ICD-10-CM | POA: Diagnosis not present

## 2019-01-26 DIAGNOSIS — F0390 Unspecified dementia without behavioral disturbance: Secondary | ICD-10-CM | POA: Diagnosis not present

## 2019-01-26 DIAGNOSIS — S72142D Displaced intertrochanteric fracture of left femur, subsequent encounter for closed fracture with routine healing: Secondary | ICD-10-CM | POA: Diagnosis not present

## 2019-01-26 DIAGNOSIS — J449 Chronic obstructive pulmonary disease, unspecified: Secondary | ICD-10-CM | POA: Diagnosis not present

## 2019-01-28 DIAGNOSIS — F0390 Unspecified dementia without behavioral disturbance: Secondary | ICD-10-CM | POA: Diagnosis not present

## 2019-01-28 DIAGNOSIS — J449 Chronic obstructive pulmonary disease, unspecified: Secondary | ICD-10-CM | POA: Diagnosis not present

## 2019-01-28 DIAGNOSIS — I480 Paroxysmal atrial fibrillation: Secondary | ICD-10-CM | POA: Diagnosis not present

## 2019-01-28 DIAGNOSIS — E46 Unspecified protein-calorie malnutrition: Secondary | ICD-10-CM | POA: Diagnosis not present

## 2019-01-28 DIAGNOSIS — S32511D Fracture of superior rim of right pubis, subsequent encounter for fracture with routine healing: Secondary | ICD-10-CM | POA: Diagnosis not present

## 2019-01-28 DIAGNOSIS — S72142D Displaced intertrochanteric fracture of left femur, subsequent encounter for closed fracture with routine healing: Secondary | ICD-10-CM | POA: Diagnosis not present

## 2019-01-30 DIAGNOSIS — S32511D Fracture of superior rim of right pubis, subsequent encounter for fracture with routine healing: Secondary | ICD-10-CM | POA: Diagnosis not present

## 2019-01-30 DIAGNOSIS — W19XXXD Unspecified fall, subsequent encounter: Secondary | ICD-10-CM | POA: Diagnosis not present

## 2019-01-30 DIAGNOSIS — J449 Chronic obstructive pulmonary disease, unspecified: Secondary | ICD-10-CM | POA: Diagnosis not present

## 2019-01-30 DIAGNOSIS — F0390 Unspecified dementia without behavioral disturbance: Secondary | ICD-10-CM | POA: Diagnosis not present

## 2019-01-30 DIAGNOSIS — Z9181 History of falling: Secondary | ICD-10-CM | POA: Diagnosis not present

## 2019-01-30 DIAGNOSIS — N189 Chronic kidney disease, unspecified: Secondary | ICD-10-CM | POA: Diagnosis not present

## 2019-01-30 DIAGNOSIS — G629 Polyneuropathy, unspecified: Secondary | ICD-10-CM | POA: Diagnosis not present

## 2019-01-30 DIAGNOSIS — K5900 Constipation, unspecified: Secondary | ICD-10-CM | POA: Diagnosis not present

## 2019-01-30 DIAGNOSIS — I480 Paroxysmal atrial fibrillation: Secondary | ICD-10-CM | POA: Diagnosis not present

## 2019-01-30 DIAGNOSIS — F329 Major depressive disorder, single episode, unspecified: Secondary | ICD-10-CM | POA: Diagnosis not present

## 2019-01-30 DIAGNOSIS — E46 Unspecified protein-calorie malnutrition: Secondary | ICD-10-CM | POA: Diagnosis not present

## 2019-01-30 DIAGNOSIS — I129 Hypertensive chronic kidney disease with stage 1 through stage 4 chronic kidney disease, or unspecified chronic kidney disease: Secondary | ICD-10-CM | POA: Diagnosis not present

## 2019-01-30 DIAGNOSIS — S72142D Displaced intertrochanteric fracture of left femur, subsequent encounter for closed fracture with routine healing: Secondary | ICD-10-CM | POA: Diagnosis not present

## 2019-02-03 DIAGNOSIS — S72142D Displaced intertrochanteric fracture of left femur, subsequent encounter for closed fracture with routine healing: Secondary | ICD-10-CM | POA: Diagnosis not present

## 2019-02-03 DIAGNOSIS — I480 Paroxysmal atrial fibrillation: Secondary | ICD-10-CM | POA: Diagnosis not present

## 2019-02-03 DIAGNOSIS — J449 Chronic obstructive pulmonary disease, unspecified: Secondary | ICD-10-CM | POA: Diagnosis not present

## 2019-02-03 DIAGNOSIS — E46 Unspecified protein-calorie malnutrition: Secondary | ICD-10-CM | POA: Diagnosis not present

## 2019-02-03 DIAGNOSIS — S32511D Fracture of superior rim of right pubis, subsequent encounter for fracture with routine healing: Secondary | ICD-10-CM | POA: Diagnosis not present

## 2019-02-03 DIAGNOSIS — F0390 Unspecified dementia without behavioral disturbance: Secondary | ICD-10-CM | POA: Diagnosis not present

## 2019-02-04 DIAGNOSIS — J449 Chronic obstructive pulmonary disease, unspecified: Secondary | ICD-10-CM | POA: Diagnosis not present

## 2019-02-04 DIAGNOSIS — I480 Paroxysmal atrial fibrillation: Secondary | ICD-10-CM | POA: Diagnosis not present

## 2019-02-04 DIAGNOSIS — S32511D Fracture of superior rim of right pubis, subsequent encounter for fracture with routine healing: Secondary | ICD-10-CM | POA: Diagnosis not present

## 2019-02-04 DIAGNOSIS — S72142D Displaced intertrochanteric fracture of left femur, subsequent encounter for closed fracture with routine healing: Secondary | ICD-10-CM | POA: Diagnosis not present

## 2019-02-04 DIAGNOSIS — F0390 Unspecified dementia without behavioral disturbance: Secondary | ICD-10-CM | POA: Diagnosis not present

## 2019-02-04 DIAGNOSIS — E46 Unspecified protein-calorie malnutrition: Secondary | ICD-10-CM | POA: Diagnosis not present

## 2019-02-05 DIAGNOSIS — F0390 Unspecified dementia without behavioral disturbance: Secondary | ICD-10-CM | POA: Diagnosis not present

## 2019-02-05 DIAGNOSIS — J449 Chronic obstructive pulmonary disease, unspecified: Secondary | ICD-10-CM | POA: Diagnosis not present

## 2019-02-05 DIAGNOSIS — E46 Unspecified protein-calorie malnutrition: Secondary | ICD-10-CM | POA: Diagnosis not present

## 2019-02-05 DIAGNOSIS — I480 Paroxysmal atrial fibrillation: Secondary | ICD-10-CM | POA: Diagnosis not present

## 2019-02-05 DIAGNOSIS — S32511D Fracture of superior rim of right pubis, subsequent encounter for fracture with routine healing: Secondary | ICD-10-CM | POA: Diagnosis not present

## 2019-02-05 DIAGNOSIS — S72142D Displaced intertrochanteric fracture of left femur, subsequent encounter for closed fracture with routine healing: Secondary | ICD-10-CM | POA: Diagnosis not present

## 2019-02-06 DIAGNOSIS — S32511D Fracture of superior rim of right pubis, subsequent encounter for fracture with routine healing: Secondary | ICD-10-CM | POA: Diagnosis not present

## 2019-02-11 DIAGNOSIS — S72142D Displaced intertrochanteric fracture of left femur, subsequent encounter for closed fracture with routine healing: Secondary | ICD-10-CM | POA: Diagnosis not present

## 2019-02-11 DIAGNOSIS — E46 Unspecified protein-calorie malnutrition: Secondary | ICD-10-CM | POA: Diagnosis not present

## 2019-02-11 DIAGNOSIS — S32511D Fracture of superior rim of right pubis, subsequent encounter for fracture with routine healing: Secondary | ICD-10-CM | POA: Diagnosis not present

## 2019-02-11 DIAGNOSIS — I480 Paroxysmal atrial fibrillation: Secondary | ICD-10-CM | POA: Diagnosis not present

## 2019-02-11 DIAGNOSIS — J449 Chronic obstructive pulmonary disease, unspecified: Secondary | ICD-10-CM | POA: Diagnosis not present

## 2019-02-11 DIAGNOSIS — F0390 Unspecified dementia without behavioral disturbance: Secondary | ICD-10-CM | POA: Diagnosis not present

## 2019-02-12 DIAGNOSIS — E46 Unspecified protein-calorie malnutrition: Secondary | ICD-10-CM | POA: Diagnosis not present

## 2019-02-12 DIAGNOSIS — J449 Chronic obstructive pulmonary disease, unspecified: Secondary | ICD-10-CM | POA: Diagnosis not present

## 2019-02-12 DIAGNOSIS — I480 Paroxysmal atrial fibrillation: Secondary | ICD-10-CM | POA: Diagnosis not present

## 2019-02-12 DIAGNOSIS — S32511D Fracture of superior rim of right pubis, subsequent encounter for fracture with routine healing: Secondary | ICD-10-CM | POA: Diagnosis not present

## 2019-02-12 DIAGNOSIS — F0390 Unspecified dementia without behavioral disturbance: Secondary | ICD-10-CM | POA: Diagnosis not present

## 2019-02-12 DIAGNOSIS — S72142D Displaced intertrochanteric fracture of left femur, subsequent encounter for closed fracture with routine healing: Secondary | ICD-10-CM | POA: Diagnosis not present

## 2019-02-16 DIAGNOSIS — N182 Chronic kidney disease, stage 2 (mild): Secondary | ICD-10-CM | POA: Diagnosis not present

## 2019-02-18 DIAGNOSIS — N182 Chronic kidney disease, stage 2 (mild): Secondary | ICD-10-CM | POA: Diagnosis not present

## 2019-02-23 ENCOUNTER — Other Ambulatory Visit: Payer: Self-pay | Admitting: Family Medicine

## 2019-02-23 DIAGNOSIS — S72142D Displaced intertrochanteric fracture of left femur, subsequent encounter for closed fracture with routine healing: Secondary | ICD-10-CM | POA: Diagnosis not present

## 2019-02-23 DIAGNOSIS — I480 Paroxysmal atrial fibrillation: Secondary | ICD-10-CM | POA: Diagnosis not present

## 2019-02-23 DIAGNOSIS — F0390 Unspecified dementia without behavioral disturbance: Secondary | ICD-10-CM | POA: Diagnosis not present

## 2019-02-23 DIAGNOSIS — J449 Chronic obstructive pulmonary disease, unspecified: Secondary | ICD-10-CM | POA: Diagnosis not present

## 2019-02-23 DIAGNOSIS — S32511D Fracture of superior rim of right pubis, subsequent encounter for fracture with routine healing: Secondary | ICD-10-CM | POA: Diagnosis not present

## 2019-02-23 DIAGNOSIS — E46 Unspecified protein-calorie malnutrition: Secondary | ICD-10-CM | POA: Diagnosis not present

## 2019-02-25 DIAGNOSIS — S32511D Fracture of superior rim of right pubis, subsequent encounter for fracture with routine healing: Secondary | ICD-10-CM | POA: Diagnosis not present

## 2019-02-25 DIAGNOSIS — I480 Paroxysmal atrial fibrillation: Secondary | ICD-10-CM | POA: Diagnosis not present

## 2019-02-25 DIAGNOSIS — E46 Unspecified protein-calorie malnutrition: Secondary | ICD-10-CM | POA: Diagnosis not present

## 2019-02-25 DIAGNOSIS — F0390 Unspecified dementia without behavioral disturbance: Secondary | ICD-10-CM | POA: Diagnosis not present

## 2019-02-25 DIAGNOSIS — S72142D Displaced intertrochanteric fracture of left femur, subsequent encounter for closed fracture with routine healing: Secondary | ICD-10-CM | POA: Diagnosis not present

## 2019-02-25 DIAGNOSIS — J449 Chronic obstructive pulmonary disease, unspecified: Secondary | ICD-10-CM | POA: Diagnosis not present

## 2019-02-27 DIAGNOSIS — N182 Chronic kidney disease, stage 2 (mild): Secondary | ICD-10-CM | POA: Diagnosis not present

## 2019-03-01 DIAGNOSIS — J449 Chronic obstructive pulmonary disease, unspecified: Secondary | ICD-10-CM | POA: Diagnosis not present

## 2019-03-01 DIAGNOSIS — I480 Paroxysmal atrial fibrillation: Secondary | ICD-10-CM | POA: Diagnosis not present

## 2019-03-01 DIAGNOSIS — Z9181 History of falling: Secondary | ICD-10-CM | POA: Diagnosis not present

## 2019-03-01 DIAGNOSIS — K5900 Constipation, unspecified: Secondary | ICD-10-CM | POA: Diagnosis not present

## 2019-03-01 DIAGNOSIS — S32511D Fracture of superior rim of right pubis, subsequent encounter for fracture with routine healing: Secondary | ICD-10-CM | POA: Diagnosis not present

## 2019-03-01 DIAGNOSIS — F0390 Unspecified dementia without behavioral disturbance: Secondary | ICD-10-CM | POA: Diagnosis not present

## 2019-03-01 DIAGNOSIS — G629 Polyneuropathy, unspecified: Secondary | ICD-10-CM | POA: Diagnosis not present

## 2019-03-01 DIAGNOSIS — F329 Major depressive disorder, single episode, unspecified: Secondary | ICD-10-CM | POA: Diagnosis not present

## 2019-03-01 DIAGNOSIS — N189 Chronic kidney disease, unspecified: Secondary | ICD-10-CM | POA: Diagnosis not present

## 2019-03-01 DIAGNOSIS — S72142D Displaced intertrochanteric fracture of left femur, subsequent encounter for closed fracture with routine healing: Secondary | ICD-10-CM | POA: Diagnosis not present

## 2019-03-01 DIAGNOSIS — I129 Hypertensive chronic kidney disease with stage 1 through stage 4 chronic kidney disease, or unspecified chronic kidney disease: Secondary | ICD-10-CM | POA: Diagnosis not present

## 2019-03-01 DIAGNOSIS — E46 Unspecified protein-calorie malnutrition: Secondary | ICD-10-CM | POA: Diagnosis not present

## 2019-03-02 DIAGNOSIS — I480 Paroxysmal atrial fibrillation: Secondary | ICD-10-CM | POA: Diagnosis not present

## 2019-03-02 DIAGNOSIS — F0390 Unspecified dementia without behavioral disturbance: Secondary | ICD-10-CM | POA: Diagnosis not present

## 2019-03-02 DIAGNOSIS — E46 Unspecified protein-calorie malnutrition: Secondary | ICD-10-CM | POA: Diagnosis not present

## 2019-03-02 DIAGNOSIS — S32511D Fracture of superior rim of right pubis, subsequent encounter for fracture with routine healing: Secondary | ICD-10-CM | POA: Diagnosis not present

## 2019-03-02 DIAGNOSIS — G629 Polyneuropathy, unspecified: Secondary | ICD-10-CM | POA: Diagnosis not present

## 2019-03-02 DIAGNOSIS — S72142D Displaced intertrochanteric fracture of left femur, subsequent encounter for closed fracture with routine healing: Secondary | ICD-10-CM | POA: Diagnosis not present

## 2019-03-02 DIAGNOSIS — J449 Chronic obstructive pulmonary disease, unspecified: Secondary | ICD-10-CM | POA: Diagnosis not present

## 2019-03-04 DIAGNOSIS — S32511D Fracture of superior rim of right pubis, subsequent encounter for fracture with routine healing: Secondary | ICD-10-CM | POA: Diagnosis not present

## 2019-03-04 DIAGNOSIS — S72142D Displaced intertrochanteric fracture of left femur, subsequent encounter for closed fracture with routine healing: Secondary | ICD-10-CM | POA: Diagnosis not present

## 2019-03-04 DIAGNOSIS — J449 Chronic obstructive pulmonary disease, unspecified: Secondary | ICD-10-CM | POA: Diagnosis not present

## 2019-03-04 DIAGNOSIS — E46 Unspecified protein-calorie malnutrition: Secondary | ICD-10-CM | POA: Diagnosis not present

## 2019-03-04 DIAGNOSIS — F0390 Unspecified dementia without behavioral disturbance: Secondary | ICD-10-CM | POA: Diagnosis not present

## 2019-03-04 DIAGNOSIS — G629 Polyneuropathy, unspecified: Secondary | ICD-10-CM | POA: Diagnosis not present

## 2019-03-05 DIAGNOSIS — E46 Unspecified protein-calorie malnutrition: Secondary | ICD-10-CM | POA: Diagnosis not present

## 2019-03-05 DIAGNOSIS — G629 Polyneuropathy, unspecified: Secondary | ICD-10-CM | POA: Diagnosis not present

## 2019-03-05 DIAGNOSIS — S72142D Displaced intertrochanteric fracture of left femur, subsequent encounter for closed fracture with routine healing: Secondary | ICD-10-CM | POA: Diagnosis not present

## 2019-03-05 DIAGNOSIS — S32511D Fracture of superior rim of right pubis, subsequent encounter for fracture with routine healing: Secondary | ICD-10-CM | POA: Diagnosis not present

## 2019-03-05 DIAGNOSIS — F0390 Unspecified dementia without behavioral disturbance: Secondary | ICD-10-CM | POA: Diagnosis not present

## 2019-03-05 DIAGNOSIS — J449 Chronic obstructive pulmonary disease, unspecified: Secondary | ICD-10-CM | POA: Diagnosis not present

## 2019-03-09 DIAGNOSIS — E46 Unspecified protein-calorie malnutrition: Secondary | ICD-10-CM | POA: Diagnosis not present

## 2019-03-09 DIAGNOSIS — S72142D Displaced intertrochanteric fracture of left femur, subsequent encounter for closed fracture with routine healing: Secondary | ICD-10-CM | POA: Diagnosis not present

## 2019-03-09 DIAGNOSIS — G629 Polyneuropathy, unspecified: Secondary | ICD-10-CM | POA: Diagnosis not present

## 2019-03-09 DIAGNOSIS — J449 Chronic obstructive pulmonary disease, unspecified: Secondary | ICD-10-CM | POA: Diagnosis not present

## 2019-03-09 DIAGNOSIS — S32511D Fracture of superior rim of right pubis, subsequent encounter for fracture with routine healing: Secondary | ICD-10-CM | POA: Diagnosis not present

## 2019-03-09 DIAGNOSIS — F0390 Unspecified dementia without behavioral disturbance: Secondary | ICD-10-CM | POA: Diagnosis not present

## 2019-03-10 DIAGNOSIS — S32511D Fracture of superior rim of right pubis, subsequent encounter for fracture with routine healing: Secondary | ICD-10-CM | POA: Diagnosis not present

## 2019-03-10 DIAGNOSIS — S72142D Displaced intertrochanteric fracture of left femur, subsequent encounter for closed fracture with routine healing: Secondary | ICD-10-CM | POA: Diagnosis not present

## 2019-03-10 DIAGNOSIS — E46 Unspecified protein-calorie malnutrition: Secondary | ICD-10-CM | POA: Diagnosis not present

## 2019-03-10 DIAGNOSIS — G629 Polyneuropathy, unspecified: Secondary | ICD-10-CM | POA: Diagnosis not present

## 2019-03-10 DIAGNOSIS — F0390 Unspecified dementia without behavioral disturbance: Secondary | ICD-10-CM | POA: Diagnosis not present

## 2019-03-10 DIAGNOSIS — J449 Chronic obstructive pulmonary disease, unspecified: Secondary | ICD-10-CM | POA: Diagnosis not present

## 2019-03-16 DIAGNOSIS — G629 Polyneuropathy, unspecified: Secondary | ICD-10-CM | POA: Diagnosis not present

## 2019-03-16 DIAGNOSIS — E46 Unspecified protein-calorie malnutrition: Secondary | ICD-10-CM | POA: Diagnosis not present

## 2019-03-16 DIAGNOSIS — S32511D Fracture of superior rim of right pubis, subsequent encounter for fracture with routine healing: Secondary | ICD-10-CM | POA: Diagnosis not present

## 2019-03-16 DIAGNOSIS — S72142D Displaced intertrochanteric fracture of left femur, subsequent encounter for closed fracture with routine healing: Secondary | ICD-10-CM | POA: Diagnosis not present

## 2019-03-16 DIAGNOSIS — F0390 Unspecified dementia without behavioral disturbance: Secondary | ICD-10-CM | POA: Diagnosis not present

## 2019-03-16 DIAGNOSIS — J449 Chronic obstructive pulmonary disease, unspecified: Secondary | ICD-10-CM | POA: Diagnosis not present

## 2019-03-17 DIAGNOSIS — J449 Chronic obstructive pulmonary disease, unspecified: Secondary | ICD-10-CM | POA: Diagnosis not present

## 2019-03-17 DIAGNOSIS — E46 Unspecified protein-calorie malnutrition: Secondary | ICD-10-CM | POA: Diagnosis not present

## 2019-03-17 DIAGNOSIS — S32511D Fracture of superior rim of right pubis, subsequent encounter for fracture with routine healing: Secondary | ICD-10-CM | POA: Diagnosis not present

## 2019-03-17 DIAGNOSIS — S72142D Displaced intertrochanteric fracture of left femur, subsequent encounter for closed fracture with routine healing: Secondary | ICD-10-CM | POA: Diagnosis not present

## 2019-03-17 DIAGNOSIS — F0390 Unspecified dementia without behavioral disturbance: Secondary | ICD-10-CM | POA: Diagnosis not present

## 2019-03-17 DIAGNOSIS — G629 Polyneuropathy, unspecified: Secondary | ICD-10-CM | POA: Diagnosis not present

## 2019-04-13 DIAGNOSIS — J410 Simple chronic bronchitis: Secondary | ICD-10-CM | POA: Diagnosis not present

## 2019-04-13 DIAGNOSIS — M810 Age-related osteoporosis without current pathological fracture: Secondary | ICD-10-CM | POA: Diagnosis not present

## 2019-04-13 DIAGNOSIS — F0390 Unspecified dementia without behavioral disturbance: Secondary | ICD-10-CM | POA: Diagnosis not present

## 2019-04-13 DIAGNOSIS — Z0000 Encounter for general adult medical examination without abnormal findings: Secondary | ICD-10-CM | POA: Diagnosis not present

## 2019-04-13 DIAGNOSIS — I129 Hypertensive chronic kidney disease with stage 1 through stage 4 chronic kidney disease, or unspecified chronic kidney disease: Secondary | ICD-10-CM | POA: Diagnosis not present

## 2019-04-13 DIAGNOSIS — N183 Chronic kidney disease, stage 3 unspecified: Secondary | ICD-10-CM | POA: Diagnosis not present

## 2019-04-13 DIAGNOSIS — M8008XA Age-related osteoporosis with current pathological fracture, vertebra(e), initial encounter for fracture: Secondary | ICD-10-CM | POA: Diagnosis not present

## 2019-04-13 DIAGNOSIS — G894 Chronic pain syndrome: Secondary | ICD-10-CM | POA: Diagnosis not present

## 2019-04-13 DIAGNOSIS — I480 Paroxysmal atrial fibrillation: Secondary | ICD-10-CM | POA: Diagnosis not present

## 2019-04-13 DIAGNOSIS — Z87891 Personal history of nicotine dependence: Secondary | ICD-10-CM | POA: Diagnosis not present

## 2019-10-12 DIAGNOSIS — R404 Transient alteration of awareness: Secondary | ICD-10-CM | POA: Diagnosis not present

## 2019-10-12 DIAGNOSIS — R4020 Unspecified coma: Secondary | ICD-10-CM | POA: Diagnosis not present

## 2019-10-12 DIAGNOSIS — R0689 Other abnormalities of breathing: Secondary | ICD-10-CM | POA: Diagnosis not present

## 2019-10-12 DIAGNOSIS — I499 Cardiac arrhythmia, unspecified: Secondary | ICD-10-CM | POA: Diagnosis not present

## 2019-10-16 DIAGNOSIS — 419620001 Death: Secondary | SNOMED CT | POA: Diagnosis not present

## 2019-10-16 DEATH — deceased
